# Patient Record
Sex: Female | Born: 1985 | Race: White | Hispanic: No | Marital: Married | Smoking: Former smoker
Health system: Southern US, Community
[De-identification: ages and names within clinical notes are randomized; demographics above are authoritative.]

## PROBLEM LIST (undated history)

## (undated) DIAGNOSIS — G8929 Other chronic pain: Secondary | ICD-10-CM

## (undated) DIAGNOSIS — M545 Low back pain, unspecified: Secondary | ICD-10-CM

## (undated) DIAGNOSIS — G43909 Migraine, unspecified, not intractable, without status migrainosus: Secondary | ICD-10-CM

## (undated) HISTORY — DX: Other chronic pain: G89.29

## (undated) HISTORY — DX: Low back pain, unspecified: M54.50

## (undated) HISTORY — DX: Migraine, unspecified, not intractable, without status migrainosus: G43.909

## (undated) HISTORY — PX: WISDOM TOOTH EXTRACTION: SHX21

---
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
## 2013-08-02 ENCOUNTER — Ambulatory Visit (INDEPENDENT_AMBULATORY_CARE_PROVIDER_SITE_OTHER): Payer: BC Managed Care – PPO | Admitting: Family Medicine

## 2013-08-02 VITALS — BP 130/70 | HR 88 | Temp 98.0°F | Resp 16 | Ht 66.5 in | Wt 184.0 lb

## 2013-08-02 DIAGNOSIS — Z0000 Encounter for general adult medical examination without abnormal findings: Secondary | ICD-10-CM

## 2013-08-02 LAB — COMPREHENSIVE METABOLIC PANEL
ALT: 18 U/L (ref 0–35)
AST: 18 U/L (ref 0–37)
Albumin: 4.5 g/dL (ref 3.5–5.2)
Alkaline Phosphatase: 68 U/L (ref 39–117)
BUN: 7 mg/dL (ref 6–23)
CO2: 29 mEq/L (ref 19–32)
Calcium: 9.9 mg/dL (ref 8.4–10.5)
Chloride: 102 mEq/L (ref 96–112)
Creat: 0.82 mg/dL (ref 0.50–1.10)
Glucose, Bld: 85 mg/dL (ref 70–99)
Potassium: 4.1 mEq/L (ref 3.5–5.3)
Sodium: 139 mEq/L (ref 135–145)
Total Bilirubin: 0.5 mg/dL (ref 0.3–1.2)
Total Protein: 7.3 g/dL (ref 6.0–8.3)

## 2013-08-02 LAB — CBC WITH DIFFERENTIAL/PLATELET
Basophils Absolute: 0 10*3/uL (ref 0.0–0.1)
Basophils Relative: 0 % (ref 0–1)
Eosinophils Absolute: 0.1 10*3/uL (ref 0.0–0.7)
Eosinophils Relative: 1 % (ref 0–5)
HCT: 36.8 % (ref 36.0–46.0)
Hemoglobin: 13.1 g/dL (ref 12.0–15.0)
Lymphocytes Relative: 21 % (ref 12–46)
Lymphs Abs: 1.7 10*3/uL (ref 0.7–4.0)
MCH: 29.9 pg (ref 26.0–34.0)
MCHC: 35.6 g/dL (ref 30.0–36.0)
MCV: 84 fL (ref 78.0–100.0)
Monocytes Absolute: 0.7 10*3/uL (ref 0.1–1.0)
Monocytes Relative: 9 % (ref 3–12)
Neutro Abs: 5.4 10*3/uL (ref 1.7–7.7)
Neutrophils Relative %: 69 % (ref 43–77)
Platelets: 330 10*3/uL (ref 150–400)
RBC: 4.38 MIL/uL (ref 3.87–5.11)
RDW: 12.9 % (ref 11.5–15.5)
WBC: 7.9 10*3/uL (ref 4.0–10.5)

## 2013-08-02 LAB — LIPID PANEL
Cholesterol: 169 mg/dL (ref 0–200)
HDL: 47 mg/dL (ref >39)
LDL Cholesterol: 100 mg/dL — ABNORMAL HIGH (ref 0–99)
Total CHOL/HDL Ratio: 3.6 Ratio
Triglycerides: 109 mg/dL (ref <150)
VLDL: 22 mg/dL (ref 0–40)

## 2013-08-02 LAB — TSH: TSH: 1.844 u[IU]/mL (ref 0.350–4.500)

## 2013-08-02 NOTE — Progress Notes (Signed)
Subjective:    Patient ID: Alice Mcbride, female    DOB: 20-Nov-1985, 28 y.o.   MRN: [id number] Chief Complaint  Patient presents with  . Annual Exam    HPI This chart was scribed for Norberto SorensonEva Shaw, MD by Andrew Auaven Small, ED Scribe. This patient was seen in room 14 and the patient's care was started at 9:15 AM.  HPI Comments: Alice Mcbride is a 28 y.o. female who presents to [facility] requesting a physical. 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 Pt reports she has never had a pap smear but states that her menstrual cycle is regular.  She is unsure if she received the Gardasil vaccine but did receive a series of 3 injections that were required when she started w/ [facility]. All immunizations are UTD per EMS requirements. She reports taking a daily vitamin and that she drinks plenty of milk for her calcium.  She is involved in a stable monogamous relationship, sexually active w/ 1 woman only, they have children in the home.  History reviewed. No pertinent past medical history. History reviewed. No pertinent past surgical history. No current outpatient prescriptions on file prior to visit.   No current facility-administered medications on file prior to visit.   No Known Allergies History reviewed. No pertinent family history. History   Social History  . Marital Status: Significant Other    Spouse Name: N/A    Number of Children: N/A  . Years of Education: N/A   Social History Main Topics  . Smoking status: Never Smoker   . Smokeless tobacco: None  . Alcohol Use: No  . Drug Use: No  . Sexual Activity: Yes   Other Topics Concern  . None   Social History Narrative  . None    Review of Systems  All other systems reviewed and are negative.      BP 130/70  Pulse 88   Temp(Src) 98 F (36.7 C) (Oral)  Resp 16  Ht 5' 6.5" (1.689 m)  Wt 184 lb (83.462 kg)  BMI 29.26 kg/m2  SpO2 98%  LMP 07/12/2013  Objective:   Physical Exam  Nursing note and vitals reviewed. Constitutional: She is oriented to person, place, and time. She appears well-developed and well-nourished. No distress.  HENT:  Head: Normocephalic and atraumatic.  Right Ear: Hearing, tympanic membrane, external ear and ear canal normal.  Left Ear: Hearing, tympanic membrane, external ear and ear canal normal.  Nose: Nose normal. No mucosal edema or rhinorrhea.  Mouth/Throat: Uvula is midline, oropharynx is clear and moist and mucous membranes are normal. No posterior oropharyngeal erythema.  Eyes: Conjunctivae and EOM are normal. Pupils are equal, round, and reactive to light. Right eye exhibits no discharge. Left eye exhibits no discharge. No scleral icterus.  Neck: Normal range of motion. Neck supple. No tracheal deviation present. No thyromegaly present.  Cardiovascular: Normal rate, regular rhythm, normal heart sounds and intact distal pulses.   No murmur heard. Pulmonary/Chest: Effort normal and breath sounds normal. No respiratory distress. She has no wheezes. She has no rales.  Abdominal: Soft. Bowel sounds are normal. She exhibits no distension and no mass. There is no tenderness. There is no rebound and no guarding.  Musculoskeletal: Normal range of motion. She exhibits no edema.  Lymphadenopathy:  She has no cervical adenopathy.  Neurological: She is alert and oriented to person, place, and time. She has normal reflexes.  Reflex Scores:      Patellar reflexes are 2+ on the right side and 2+ on the left side. Skin: Skin is warm and dry. She is not diaphoretic. No erythema.  Psychiatric: She has a normal mood and affect. Her behavior is normal.      Assessment & Plan:  Pt denies pap smear. She reports that she will receive a pap smear next year. Routine general medical examination  at a health care facility - Plan: CBC with Differential, Comprehensive metabolic panel, Lipid panel, TSH No h/o prior pap smear so pt is definitely due despite being very low risk.  She declines this yr but will plan to have done next yr.  Not sure if pt has had guardasil vaccine - doubtful.  Pt likely has had Hep B vaccine. TdaP and flu UTD.  I personally performed the services described in this documentation, which was scribed in my presence. The recorded information has been reviewed and considered, and addended by me as needed.  Norberto Sorenson, MD MPH

## 2013-08-02 NOTE — Patient Instructions (Signed)

## 2013-08-05 ENCOUNTER — Encounter: Payer: Self-pay | Admitting: Family Medicine

## 2015-08-09 DIAGNOSIS — Z79899 Other long term (current) drug therapy: Secondary | ICD-10-CM | POA: Diagnosis not present

## 2015-08-09 DIAGNOSIS — L700 Acne vulgaris: Secondary | ICD-10-CM | POA: Diagnosis not present

## 2015-08-17 DIAGNOSIS — Z79899 Other long term (current) drug therapy: Secondary | ICD-10-CM | POA: Diagnosis not present

## 2015-08-17 DIAGNOSIS — L700 Acne vulgaris: Secondary | ICD-10-CM | POA: Diagnosis not present

## 2015-08-20 MED FILL — MYORISAN 40 MG CAPSULE: 40 | 30 days supply | Qty: 60 | Fill #0

## 2015-09-14 DIAGNOSIS — Z79899 Other long term (current) drug therapy: Secondary | ICD-10-CM | POA: Diagnosis not present

## 2015-09-14 DIAGNOSIS — L308 Other specified dermatitis: Secondary | ICD-10-CM | POA: Diagnosis not present

## 2015-09-14 DIAGNOSIS — L700 Acne vulgaris: Secondary | ICD-10-CM | POA: Diagnosis not present

## 2015-09-14 MED FILL — TRIAMCINOLONE 0.1% OINTMENT: 0.1 | 14 days supply | Qty: 80 | Fill #0

## 2015-10-04 DIAGNOSIS — L700 Acne vulgaris: Secondary | ICD-10-CM | POA: Diagnosis not present

## 2015-10-04 DIAGNOSIS — Z79899 Other long term (current) drug therapy: Secondary | ICD-10-CM | POA: Diagnosis not present

## 2015-10-08 MED FILL — MYORISAN 40 MG CAPSULE: 40 | 30 days supply | Qty: 60 | Fill #0

## 2015-11-09 DIAGNOSIS — M542 Cervicalgia: Secondary | ICD-10-CM | POA: Diagnosis not present

## 2016-10-13 ENCOUNTER — Ambulatory Visit (INDEPENDENT_AMBULATORY_CARE_PROVIDER_SITE_OTHER): Payer: BLUE CROSS/BLUE SHIELD | Admitting: Physician Assistant

## 2016-10-13 VITALS — BP 135/83 | HR 80 | Temp 98.9°F | Resp 18 | Ht 69.0 in | Wt 197.0 lb

## 2016-10-13 DIAGNOSIS — R51 Headache: Secondary | ICD-10-CM | POA: Diagnosis not present

## 2016-10-13 DIAGNOSIS — R519 Headache, unspecified: Secondary | ICD-10-CM | POA: Insufficient documentation

## 2016-10-13 DIAGNOSIS — G4489 Other headache syndrome: Secondary | ICD-10-CM | POA: Diagnosis not present

## 2016-10-13 MED ORDER — BUTALBITAL-APAP-CAFFEINE 50-325-40 MG PO TABS: 1.0000 | ORAL_TABLET | Freq: Four times a day (QID) | ORAL | 0 refills | Status: DC | PRN

## 2016-10-13 MED ORDER — CYCLOBENZAPRINE HCL 10 MG PO TABS: 10.0000 mg | ORAL_TABLET | Freq: Three times a day (TID) | ORAL | 0 refills | Status: DC | PRN

## 2016-10-13 NOTE — Progress Notes (Signed)
Patient ID: Alice Mcbride, female     DOB: 1986-05-30, 31 y.o.    MRN: [id number]  PCP: No PCP Per Patient  Chief Complaint  Patient presents with  . Migraine    x 3 weeks    Subjective:   This patient is new to me and presents for evaluation of headache. Previously seen in this office 08/02/2013 for routine exam.  3 weeks ago, after getting out of the shower, leaned over to dry her hair, as per her typical routine. Stood up and felt a sharp pain LEFT side of head behind the ear, that lasted about 30 minutes.  Recurred a few days later with a similar movement, the same pain and location. Has had pain constantly since then. Most of the pain is now at the back of her head, at the top of the neck. Rates the pain 5/10 presently, 8/10 at its worst. Ibuprofen helps a little, reducing the pain to 3/10. Quality is throbbing ache, occasional sharp pain.  Claritin D, ibuprofen, Excedrin without benefit.  Last Thursday (10/05/2016) presented to another facility, received Augmentin, steroid injection and Toradol injection for presumed sinusitis, even though she didn't feel like previous sinusitis episodes she's had. Has had no improvement.  Rapid position changes cause some dizziness. No visual changes. No CP, palpitation, SOB. A couple of episodes of nausea since starting Augmentin. No vomiting or diarrhea. Occasional ear pain, bilateral, lasting only a couple of seconds. No shoulder, arm/hand pain or tingling. Not dropping things. No loss of bowel or bladder control. No urinary frequency, urgency or burning. No diarrhea.  Unable to sleep due to the pain. Exhausted. Unable to attend family functions.  Always had frequent HAs. Never evaluated, but she calls them "migraines," and describes them as bad headaches resolving with rest in a dark, quiet room.   Review of Systems As above.  Prior to Admission medications   Medication Sig Start Date End Date Taking? Authorizing  Provider  amoxicillin-clavulanate (AUGMENTIN) 875-125 MG tablet Take 1 tablet by mouth 2 (two) times daily. 10/06/16 10/14/16 Yes Historical Provider, MD     No Known Allergies   There are no active problems to display for this patient.    No family history on file.   Social History   Social History  . Marital status: Significant Other    Spouse name: Deanna  . Number of children: 0  . Years of education: EMT certification   Occupational History  . EMT   .  [employer]   Social History Main Topics  . Smoking status: Never Smoker  . Smokeless tobacco: Never Used  . Alcohol use No  . Drug use: No  . Sexual activity: Yes    Partners: Female    Birth control/ protection: None   Other Topics Concern  . Not on file   Social History Narrative   Lives with her partner and her partner's two children.   Parents live in [place].         Objective:  Physical Exam  Constitutional: She is oriented to person, place, and time. She appears well-developed and well-nourished. She is active and cooperative. No distress.  BP 135/83   Pulse 80   Temp 98.9 F (37.2 C) (Oral)   Resp 18   Ht 5\' 9"  (1.753 m)   Wt 197 lb (89.4 kg)   LMP 09/03/2016   SpO2 99%   BMI 29.09 kg/m   HENT:  Head: Normocephalic and atraumatic.  Right Ear: Hearing, tympanic membrane, external ear and ear canal normal.  Left Ear: Hearing, tympanic membrane, external ear and ear canal normal.  Nose: Nose normal. Right sinus exhibits no maxillary sinus tenderness and no frontal sinus tenderness. Left sinus exhibits no maxillary sinus tenderness and no frontal sinus tenderness.  Mouth/Throat: Uvula is midline, oropharynx is clear and moist and mucous membranes are normal. No oral lesions. Normal dentition. No oropharyngeal exudate.  Eyes: Conjunctivae, EOM and lids are normal. Pupils are equal, round, and reactive to light. No scleral icterus.  Fundoscopic exam:      The right eye shows no  hemorrhage and no papilledema. The right eye shows red reflex.       The left eye shows no hemorrhage and no papilledema. The left eye shows red reflex.  Neck: Normal range of motion and full passive range of motion without pain. Neck supple. No spinous process tenderness and no muscular tenderness present. No neck rigidity. Normal range of motion present. No thyromegaly present.  Cardiovascular: Normal rate, regular rhythm and normal heart sounds.   Pulses:      Radial pulses are 2+ on the right side, and 2+ on the left side.  Pulmonary/Chest: Effort normal and breath sounds normal.  Lymphadenopathy:       Head (right side): No tonsillar, no preauricular, no posterior auricular and no occipital adenopathy present.       Head (left side): No tonsillar, no preauricular, no posterior auricular and no occipital adenopathy present.    She has no cervical adenopathy.       Right: No supraclavicular adenopathy present.       Left: No supraclavicular adenopathy present.  Neurological: She is alert and oriented to person, place, and time. She has normal strength. No cranial nerve deficit or sensory deficit.  Skin: Skin is warm, dry and intact. No rash noted. No cyanosis or erythema. Nails show no clubbing.  Psychiatric: She has a normal mood and affect. Her speech is normal and behavior is normal.      Assessment & Plan:  1. Other headache syndrome 2. Nonintractable episodic headache, unspecified headache type 2 different headache issues here. THe acute headache is most pressing. Location consistent with tension headache, but she has no tenderness. No neurologic findings. Treat with muscle relaxant and Fioricet (short term). Certainly possible this is migraine variant, though it is different from the headaches she typically has (which are likely migrainous), and the onset with raid position change seems unusual. Refer to neurology. - cyclobenzaprine (FLEXERIL) 10 MG tablet; Take 1 tablet (10 mg total)  by mouth 3 (three) times daily as needed for muscle spasms.  Dispense: 30 tablet; Refill: 0 - butalbital-acetaminophen-caffeine (FIORICET, ESGIC) 50-325-40 MG tablet; Take 1-2 tablets by mouth every 6 (six) hours as needed for headache.  Dispense: 20 tablet; Refill: 0 - Ambulatory referral to Neurology   Fernande Brashelle S. Kallen Delatorre, PA-C Physician Assistant-Certified Primary Care at [facility]

## 2016-10-13 NOTE — Patient Instructions (Addendum)
Complete the Augmentin. Stay well hydrated.    IF you received an x-ray today, you will receive an invoice from [facility]. Please contact [facility] at [phone] with questions or concerns regarding your invoice.   IF you received labwork today, you will receive an invoice from [facility]. Please contact [facility] at [phone] with questions or concerns regarding your invoice.   Our billing staff will not be able to assist you with questions regarding bills from these companies.  You will be contacted with the lab results as soon as they are available. The fastest way to get your results is to activate your My Chart account. Instructions are located on the last page of this paperwork. If you have not heard from us regarding the results in 2 weeks, please contact this office.

## 2016-10-16 ENCOUNTER — Encounter: Payer: Self-pay | Admitting: Physician Assistant

## 2016-10-16 DIAGNOSIS — G4489 Other headache syndrome: Secondary | ICD-10-CM

## 2016-10-19 ENCOUNTER — Telehealth: Payer: Self-pay | Admitting: Physician Assistant

## 2016-10-19 DIAGNOSIS — G4489 Other headache syndrome: Secondary | ICD-10-CM

## 2016-10-19 NOTE — Telephone Encounter (Signed)
Pt is following up on her e-mail sent through MyChart. She said she was able to get an appt scheduled with [facility] for 11/08/16. She said the Fioricet is helping her migraines but she only has 4 or 5 left. Pt was wondering if she could get a refill of this medication since her appt is a few weeks away. Best pt callback number is [phone].

## 2016-10-20 MED ORDER — BUTALBITAL-APAP-CAFFEINE 50-325-40 MG PO TABS: 1.0000 | ORAL_TABLET | Freq: Four times a day (QID) | ORAL | 0 refills | Status: AC | PRN

## 2016-10-20 NOTE — Telephone Encounter (Signed)
Meds ordered this encounter  Medications  . butalbital-acetaminophen-caffeine (FIORICET, ESGIC) 50-325-40 MG tablet    Sig: Take 1-2 tablets by mouth every 6 (six) hours as needed for headache.    Dispense:  20 tablet    Refill:  0    Order Specific Question:   Supervising Provider    Answer:   Sherren Mocha [phone]   Patient notified via My Chart.

## 2016-10-20 NOTE — Telephone Encounter (Signed)
Done earlier today. Patient notified via My Chart.

## 2016-11-01 ENCOUNTER — Encounter: Payer: Self-pay | Admitting: Physician Assistant

## 2016-11-02 ENCOUNTER — Other Ambulatory Visit: Payer: Self-pay | Admitting: Physician Assistant

## 2016-11-02 MED ORDER — BUTALBITAL-APAP-CAFFEINE 50-325-40 MG PO TABS: 1.0000 | ORAL_TABLET | Freq: Four times a day (QID) | ORAL | 0 refills | Status: DC | PRN

## 2016-11-02 NOTE — Telephone Encounter (Signed)
Meds ordered this encounter  Medications  . butalbital-acetaminophen-caffeine (FIORICET, ESGIC) 50-325-40 MG tablet    Sig: Take 1-2 tablets by mouth every 6 (six) hours as needed for headache.    Dispense:  20 tablet    Refill:  0    Order Specific Question:   Supervising Provider    Answer:   SHAW, EVA N [[id number]]   Patient notified via My Chart.  

## 2016-11-05 NOTE — Telephone Encounter (Signed)
4/12/18it was refilled

## 2016-11-07 ENCOUNTER — Encounter: Payer: Self-pay | Admitting: Physician Assistant

## 2016-11-07 ENCOUNTER — Ambulatory Visit: Payer: BLUE CROSS/BLUE SHIELD | Admitting: Neurology

## 2016-11-09 ENCOUNTER — Encounter: Payer: Self-pay | Admitting: Physician Assistant

## 2016-11-10 ENCOUNTER — Encounter: Payer: Self-pay | Admitting: Physician Assistant

## 2016-11-10 DIAGNOSIS — G4489 Other headache syndrome: Secondary | ICD-10-CM

## 2016-11-13 MED ORDER — BUTALBITAL-APAP-CAFFEINE 50-325-40 MG PO TABS: 1.0000 | ORAL_TABLET | Freq: Four times a day (QID) | ORAL | 0 refills | Status: DC | PRN

## 2016-11-13 MED ORDER — VALACYCLOVIR HCL 1 G PO TABS: ORAL_TABLET | ORAL | 1 refills | Status: DC

## 2016-11-13 MED ORDER — CYCLOBENZAPRINE HCL 10 MG PO TABS: 10.0000 mg | ORAL_TABLET | Freq: Three times a day (TID) | ORAL | 0 refills | Status: DC | PRN

## 2016-11-13 NOTE — Telephone Encounter (Signed)
Meds ordered this encounter  Medications  . butalbital-acetaminophen-caffeine (FIORICET, ESGIC) 50-325-40 MG tablet    Sig: Take 1-2 tablets by mouth every 6 (six) hours as needed for headache.    Dispense:  20 tablet    Refill:  0    Order Specific Question:   Supervising Provider    Answer:   SHAW, EVA N [[id number]]  . cyclobenzaprine (FLEXERIL) 10 MG tablet    Sig: Take 1 tablet (10 mg total) by mouth 3 (three) times daily as needed for muscle spasms.    Dispense:  30 tablet    Refill:  0    Order Specific Question:   Supervising Provider    Answer:   Clelia Croft, EVA N [[id number]]

## 2016-11-21 ENCOUNTER — Ambulatory Visit (INDEPENDENT_AMBULATORY_CARE_PROVIDER_SITE_OTHER): Payer: BLUE CROSS/BLUE SHIELD | Admitting: Neurology

## 2016-11-21 ENCOUNTER — Encounter: Payer: Self-pay | Admitting: Neurology

## 2016-11-21 VITALS — BP 129/89 | HR 74 | Ht 68.5 in | Wt 189.0 lb

## 2016-11-21 DIAGNOSIS — R51 Headache with orthostatic component, not elsewhere classified: Secondary | ICD-10-CM

## 2016-11-21 DIAGNOSIS — G43011 Migraine without aura, intractable, with status migrainosus: Secondary | ICD-10-CM | POA: Diagnosis not present

## 2016-11-21 DIAGNOSIS — H539 Unspecified visual disturbance: Secondary | ICD-10-CM | POA: Diagnosis not present

## 2016-11-21 DIAGNOSIS — R519 Headache, unspecified: Secondary | ICD-10-CM

## 2016-11-21 DIAGNOSIS — G8929 Other chronic pain: Secondary | ICD-10-CM

## 2016-11-21 MED ORDER — PROCHLORPERAZINE MALEATE 10 MG PO TABS: ORAL_TABLET | ORAL | 3 refills | Status: DC

## 2016-11-21 MED ORDER — METHYLPREDNISOLONE 4 MG PO TBPK: ORAL_TABLET | ORAL | 1 refills | Status: DC

## 2016-11-21 MED ORDER — SUMATRIPTAN SUCCINATE 100 MG PO TABS: 100.0000 mg | ORAL_TABLET | Freq: Once | ORAL | 12 refills | Status: DC | PRN

## 2016-11-21 MED ORDER — TOPIRAMATE ER 100 MG PO SPRINKLE CAP24: 100.0000 mg | EXTENDED_RELEASE_CAPSULE | Freq: Every day | ORAL | 12 refills | Status: DC

## 2016-11-21 NOTE — Patient Instructions (Addendum)
Remember to drink plenty of fluid, eat healthy meals and do not skip any meals. Try to eat protein with a every meal and eat a healthy snack such as fruit or nuts in between meals. Try to keep a regular sleep-wake schedule and try to exercise daily, particularly in the form of walking, 20-30 minutes a day, if you can.   As far as your medications are concerned, I would like to suggest:  Remember to drink plenty of fluid, eat healthy meals and do not skip any meals. Try to eat protein with a every meal and eat a healthy snack such as fruit or nuts in between meals. Try to keep a regular sleep-wake schedule and try to exercise daily, particularly in the form of walking, 20-30 minutes a day, if you can.   As far as your medications are concerned, I would like to suggest: - Stop daily over the counter med use (Tylenol, excedrin, ibuprofen, Fioricet, alleve) Do not tak emore than 2-3x in one week - For the next 6 days take steroids (medrol) and compazine. Watch for sedation, do not drive unless you know how the compazine affects you. With flexeril,  may increase sedation.  -At onset of Migraine Imitrex and may also take compazine. Please take one tablet at the onset of your headache. If it does not improve the symptoms please take one additional tablet. Do not take more then 2 tablets in 24hrs. Do not take use more then 2 to 3 days in a week. - Qudexy week one , week two  week three . Takes 4-6 weeks for medications to work.  As far as diagnostic testing: If headaches persist need MRI brain  Discussed; To prevent or relieve headaches, try the following:  Cool Compress. Lie down and place a cool compress on your head.   Avoid headache triggers. If certain foods or odors seem to have triggered your migraines in the past, avoid them. A headache diary might help you identify triggers.   Include physical activity in your daily routine. Try a daily walk or other moderate aerobic exercise.    Manage stress. Find healthy ways to cope with the stressors, such as delegating tasks on your to-do list.   Practice relaxation techniques. Try deep breathing, yoga, massage and visualization.   Eat regularly. Eating regularly scheduled meals and maintaining a healthy diet might help prevent headaches. Also, drink plenty of fluids.   Follow a regular sleep schedule. Sleep deprivation might contribute to headaches  Consider biofeedback. With this mind-body technique, you learn to control certain bodily functions - such as muscle tension, heart rate and blood pressure - to prevent headaches or reduce headache pain.    Proceed to emergency room if you experience new or worsening symptoms or symptoms do not resolve, if you have new neurologic symptoms or if headache is severe, or for any concerning symptom.    As far as diagnostic testing: MRI brain  I would like to see you back in 6-8 weeks, sooner if we need to. Please call us with any interim questions, concerns, problems, updates or refill requests.   Our phone number is [phone]. We also have an after hours call service for urgent matters and there is a physician on-call for urgent questions. For any emergencies you know to    Methylprednisolone tablets What is this medicine? METHYLPREDNISOLONE (meth ill pred NISS oh lone) is a corticosteroid. It is commonly used to treat inflammation of the skin, joints, lungs, and other organs.  Common conditions treated include asthma, allergies, and arthritis. It is also used for other conditions, such as blood disorders and diseases of the adrenal glands. This medicine may be used for other purposes; ask your health care provider or pharmacist if you have questions. COMMON BRAND NAME(S): Medrol, Medrol Dosepak What should I tell my health care provider before I take this medicine? They need to know if you have any of these conditions: -Cushing's syndrome -eye disease, vision  problems -diabetes -glaucoma -heart disease -high blood pressure -infection (especially a virus infection such as chickenpox, cold sores, or herpes) -liver disease -mental illness -myasthenia gravis -osteoporosis -recently received or scheduled to receive a vaccine -seizures -stomach or intestine problems -thyroid disease -an unusual or allergic reaction to lactose, methylprednisolone, other medicines, foods, dyes, or preservatives -pregnant or trying to get pregnant -breast-feeding How should I use this medicine? Take this medicine by mouth with a glass of water. Follow the directions on the prescription label. Take this medicine with food. If you are taking this medicine once a day, take it in the morning. Do not take it more often than directed. Do not suddenly stop taking your medicine because you may develop a severe reaction. Your doctor will tell you how much medicine to take. If your doctor wants you to stop the medicine, the dose may be slowly lowered over time to avoid any side effects. Talk to your pediatrician regarding the use of this medicine in children. Special care may be needed. Overdosage: If you think you have taken too much of this medicine contact a poison control center or emergency room at once. NOTE: This medicine is only for you. Do not share this medicine with others. What if I miss a dose? If you miss a dose, take it as soon as you can. If it is almost time for your next dose, talk to your doctor or health care professional. You may need to miss a dose or take an extra dose. Do not take double or extra doses without advice. What may interact with this medicine? Do not take this medicine with any of the following medications: -alefacept -echinacea -live virus vaccines -metyrapone -mifepristone This medicine may also interact with the following medications: -amphotericin B -aspirin and aspirin-like medicines -certain antibiotics like erythromycin,  clarithromycin, troleandomycin -certain medicines for diabetes -certain medicines for fungal infections like ketoconazole -certain medicines for seizures like carbamazepine, phenobarbital, phenytoin -certain medicines that treat or prevent blood clots like warfarin -cholestyramine -cyclosporine -digoxin -diuretics -female hormones, like estrogens and birth control pills -isoniazid -NSAIDs, medicines for pain inflammation, like ibuprofen or naproxen -other medicines for myasthenia gravis -rifampin -vaccines This list may not describe all possible interactions. Give your health care provider a list of all the medicines, herbs, non-prescription drugs, or dietary supplements you use. Also tell them if you smoke, drink alcohol, or use illegal drugs. Some items may interact with your medicine. What should I watch for while using this medicine? Tell your doctor or healthcare professional if your symptoms do not start to get better or if they get worse. Do not stop taking except on your doctor's advice. You may develop a severe reaction. Your doctor will tell you how much medicine to take. This medicine may increase your risk of getting an infection. Tell your doctor or health care professional if you are around anyone with measles or chickenpox, or if you develop sores or blisters that do not heal properly. This medicine may affect blood sugar levels. If you have  diabetes, check with your doctor or health care professional before you change your diet or the dose of your diabetic medicine. Tell your doctor or health care professional right away if you have any change in your eyesight. Using this medicine for a long time may increase your risk of low bone mass. Talk to your doctor about bone health. What side effects may I notice from receiving this medicine? Side effects that you should report to your doctor or health care professional as soon as possible: -allergic reactions like skin rash, itching  or hives, swelling of the face, lips, or tongue -bloody or tarry stools -changes in vision -hallucination, loss of contact with reality -muscle cramps -muscle pain -palpitations -signs and symptoms of high blood sugar such as dizziness; dry mouth; dry skin; fruity breath; nausea; stomach pain; increased hunger or thirst; increased urination -signs and symptoms of infection like fever or chills; cough; sore throat; pain or trouble passing urine -trouble passing urine or change in the amount of urine Side effects that usually do not require medical attention (report to your doctor or health care professional if they continue or are bothersome): -changes in emotions or mood -constipation -diarrhea -excessive hair growth on the face or body -headache -nausea, vomiting -trouble sleeping -weight gain This list may not describe all possible side effects. Call your doctor for medical advice about side effects. You may report side effects to FDA at 1-800-FDA-1088. Where should I keep my medicine? Keep out of the reach of children. Store at room temperature between 20 and 25 degrees C (68 and 77 degrees F). Throw away any unused medicine after the expiration date. NOTE: This sheet is a summary. It may not cover all possible information. If you have questions about this medicine, talk to your doctor, pharmacist, or health care provider.  2018 [employer] Standard (2015-09-16 15:53:30) call 911 or go to the nearest emergency room Prochlorperazine tablets What is this medicine? PROCHLORPERAZINE (proe klor PER a zeen) helps to control severe nausea and vomiting. This medicine is also used to treat schizophrenia. It can also help patients who experience anxiety that is not due to psychological illness. This medicine may be used for other purposes; ask your health care provider or pharmacist if you have questions. COMMON BRAND NAME(S): Compazine What should I tell my health care provider before I  take this medicine? They need to know if you have any of these conditions: -blood disorders or disease -dementia -liver disease or jaundice -Parkinson's disease -uncontrollable movement disorder -an unusual or allergic reaction to prochlorperazine, other medicines, foods, dyes, or preservatives -pregnant or trying to get pregnant -breast-feeding How should I use this medicine? Take this medicine by mouth with a glass of water. Follow the directions on the prescription label. Take your doses at regular intervals. Do not take your medicine more often than directed. Do not stop taking this medicine suddenly. This can cause nausea, vomiting, and dizziness. Ask your doctor or health care professional for advice. Talk to your pediatrician regarding the use of this medicine in children. Special care may be needed. While this drug may be prescribed for children as young as 2 years for selected conditions, precautions do apply. Overdosage: If you think you have taken too much of this medicine contact a poison control center or emergency room at once. NOTE: This medicine is only for you. Do not share this medicine with others. What if I miss a dose? If you miss a dose, take it as soon as you  can. If it is almost time for your next dose, take only that dose. Do not take double or extra doses. What may interact with this medicine? Do not take this medicine with any of the following medications: -amoxapine -antidepressants like citalopram, escitalopram, fluoxetine, paroxetine, and sertraline -deferoxamine -dofetilide -maprotiline -tricyclic antidepressants like amitriptyline, clomipramine, imipramine, nortiptyline and others This medicine may also interact with the following medications: -lithium -medicines for pain -phenytoin -propranolol -warfarin This list may not describe all possible interactions. Give your health care provider a list of all the medicines, herbs, non-prescription drugs, or  dietary supplements you use. Also tell them if you smoke, drink alcohol, or use illegal drugs. Some items may interact with your medicine. What should I watch for while using this medicine? Visit your doctor or health care professional for regular checks on your progress. You may get drowsy or dizzy. Do not drive, use machinery, or do anything that needs mental alertness until you know how this medicine affects you. Do not stand or sit up quickly, especially if you are an older patient. This reduces the risk of dizzy or fainting spells. Alcohol may interfere with the effect of this medicine. Avoid alcoholic drinks. This medicine can reduce the response of your body to heat or cold. Dress warm in cold weather and stay hydrated in hot weather. If possible, avoid extreme temperatures like saunas, hot tubs, very hot or cold showers, or activities that can cause dehydration such as vigorous exercise. This medicine can make you more sensitive to the sun. Keep out of the sun. If you cannot avoid being in the sun, wear protective clothing and use sunscreen. Do not use sun lamps or tanning beds/booths. Your mouth may get dry. Chewing sugarless gum or sucking hard candy, and drinking plenty of water may help. Contact your doctor if the problem does not go away or is severe. What side effects may I notice from receiving this medicine? Side effects that you should report to your doctor or health care professional as soon as possible: -blurred vision -breast enlargement in men or women -breast milk in women who are not breast-feeding -chest pain, fast or irregular heartbeat -confusion, restlessness -dark yellow or brown urine -difficulty breathing or swallowing -dizziness or fainting spells -drooling, shaking, movement difficulty (shuffling walk) or rigidity -fever, chills, sore throat -involuntary or uncontrollable movements of the eyes, mouth, head, arms, and legs -seizures -stomach area pain -unusually  weak or tired -unusual bleeding or bruising -yellowing of skin or eyes Side effects that usually do not require medical attention (report to your doctor or health care professional if they continue or are bothersome): -difficulty passing urine -difficulty sleeping -headache -sexual dysfunction -skin rash, or itching This list may not describe all possible side effects. Call your doctor for medical advice about side effects. You may report side effects to FDA at 1-800-FDA-1088. Where should I keep my medicine? Keep out of the reach of children. Store at room temperature between 15 and 30 degrees C (59 and 86 degrees F). Protect from light. Throw away any unused medicine after the expiration date. NOTE: This sheet is a summary. It may not cover all possible information. If you have questions about this medicine, talk to your doctor, pharmacist, or health care provider.  2018 [employer] Standard (2011-11-28 16:59:39)  Sumatriptan tablets What is this medicine? SUMATRIPTAN (soo ma TRIP tan) is used to treat migraines with or without aura. An aura is a strange feeling or visual disturbance that warns you of an  attack. It is not used to prevent migraines. This medicine may be used for other purposes; ask your health care provider or pharmacist if you have questions. COMMON BRAND NAME(S): Imitrex, Migraine Pack What should I tell my health care provider before I take this medicine? They need to know if you have any of these conditions: -circulation problems in fingers and toes -diabetes -heart disease -high blood pressure -high cholesterol -history of irregular heartbeat -history of stroke -kidney disease -liver disease -postmenopausal or surgical removal of uterus and ovaries -seizures -smoke tobacco -stomach or intestine problems -an unusual or allergic reaction to sumatriptan, other medicines, foods, dyes, or preservatives -pregnant or trying to get pregnant -breast-feeding How  should I use this medicine? Take this medicine by mouth with a glass of water. Follow the directions on the prescription label. This medicine is taken at the first symptoms of a migraine. It is not for everyday use. If your migraine headache returns after one dose, you can take another dose as directed. You must leave at least 2 hours between doses, and do not take more than 100 mg as a single dose. Do not take more than 200 mg total in any 24 hour period. If there is no improvement at all after the first dose, do not take a second dose without talking to your doctor or health care professional. Do not take your medicine more often than directed. Talk to your pediatrician regarding the use of this medicine in children. Special care may be needed. Overdosage: If you think you have taken too much of this medicine contact a poison control center or emergency room at once. NOTE: This medicine is only for you. Do not share this medicine with others. What if I miss a dose? This does not apply; this medicine is not for regular use. What may interact with this medicine? Do not take this medicine with any of the following medicines: -cocaine -ergot alkaloids like dihydroergotamine, ergonovine, ergotamine, methylergonovine -feverfew -MAOIs like Carbex, Eldepryl, Marplan, Nardil, and Parnate -other medicines for migraine headache like almotriptan, eletriptan, frovatriptan, naratriptan, rizatriptan, zolmitriptan -tryptophan This medicine may also interact with the following medications: -certain medicines for depression, anxiety, or psychotic disturbances This list may not describe all possible interactions. Give your health care provider a list of all the medicines, herbs, non-prescription drugs, or dietary supplements you use. Also tell them if you smoke, drink alcohol, or use illegal drugs. Some items may interact with your medicine. What should I watch for while using this medicine? Only take this  medicine for a migraine headache. Take it if you get warning symptoms or at the start of a migraine attack. It is not for regular use to prevent migraine attacks. You may get drowsy or dizzy. Do not drive, use machinery, or do anything that needs mental alertness until you know how this medicine affects you. To reduce dizzy or fainting spells, do not sit or stand up quickly, especially if you are an older patient. Alcohol can increase drowsiness, dizziness and flushing. Avoid alcoholic drinks. Smoking cigarettes may increase the risk of heart-related side effects from using this medicine. If you take migraine medicines for 10 or more days a month, your migraines may get worse. Keep a diary of headache days and medicine use. Contact your healthcare professional if your migraine attacks occur more frequently. What side effects may I notice from receiving this medicine? Side effects that you should report to your doctor or health care professional as soon as possible: -allergic  reactions like skin rash, itching or hives, swelling of the face, lips, or tongue -bloody or watery diarrhea -hallucination, loss of contact with reality -pain, tingling, numbness in the face, hands, or feet -seizures -signs and symptoms of a blood clot such as breathing problems; changes in vision; chest pain; severe, sudden headache; pain, swelling, warmth in the leg; trouble speaking; sudden numbness or weakness of the face, arm, or leg -signs and symptoms of a dangerous change in heartbeat or heart rhythm like chest pain; dizziness; fast or irregular heartbeat; palpitations, feeling faint or lightheaded; falls; breathing problems -signs and symptoms of a stroke like changes in vision; confusion; trouble speaking or understanding; severe headaches; sudden numbness or weakness of the face, arm, or leg; trouble walking; dizziness; loss of balance or coordination -stomach pain Side effects that usually do not require medical  attention (report to your doctor or health care professional if they continue or are bothersome): -changes in taste -facial flushing -headache -muscle cramps -muscle pain -nausea, vomiting -weak or tired This list may not describe all possible side effects. Call your doctor for medical advice about side effects. You may report side effects to FDA at 1-800-FDA-1088. Where should I keep my medicine? Keep out of the reach of children. Store at room temperature between 2 and 30 degrees C (36 and 86 degrees F). Throw away any unused medicine after the expiration date. NOTE: This sheet is a summary. It may not cover all possible information. If you have questions about this medicine, talk to your doctor, pharmacist, or health care provider.  2018 [employer] Standard (2015-08-12 12:38:23)  Topiramate extended-release capsules What is this medicine? TOPIRAMATE (toe PYRE a mate) is used to treat seizures in adults or children with epilepsy. It is also used for the prevention of migraine headaches. This medicine may be used for other purposes; ask your health care provider or pharmacist if you have questions. COMMON BRAND NAME(S): Trokendi XR What should I tell my health care provider before I take this medicine? They need to know if you have any of these conditions: -cirrhosis of the liver or liver disease -diarrhea -glaucoma -kidney stones or kidney disease -lung disease like asthma, obstructive pulmonary disease, emphysema -metabolic acidosis -on a ketogenic diet -scheduled for surgery or a procedure -suicidal thoughts, plans, or attempt; a previous suicide attempt by you or a family member -an unusual or allergic reaction to topiramate, other medicines, foods, dyes, or preservatives -pregnant or trying to get pregnant -breast-feeding How should I use this medicine? Take this medicine by mouth with a glass of water. Follow the directions on the prescription label. Trokendi XR capsules  must be swallowed whole. Do not sprinkle on food, break, crush, dissolve, or chew. Qudexy XR capsules may be swallowed whole or opened and sprinkled on a small amount of soft food. This mixture must be swallowed immediately. Do not chew or store mixture for later use. You may take this medicine with meals. Take your medicine at regular intervals. Do not take it more often than directed. Talk to your pediatrician regarding the use of this medicine in children. Special care may be needed. While Trokendi XR may be prescribed for children as young as 6 years and Qudexy XR may be prescribed for children as young as 2 years for selected conditions, precautions do apply. Overdosage: If you think you have taken too much of this medicine contact a poison control center or emergency room at once. NOTE: This medicine is only for you. Do not  share this medicine with others. What if I miss a dose? If you miss a dose, take it as soon as you can. If it is almost time for your next dose, take only that dose. Do not take double or extra doses. What may interact with this medicine? Do not take this medicine with any of the following medications: -probenecid This medicine may also interact with the following medications: -acetazolamide -alcohol -amitriptyline -birth control pills -digoxin -hydrochlorothiazide -lithium -medicines for pain, sleep, or muscle relaxation -metformin -methazolamide -other seizure or epilepsy medicines -pioglitazone -risperidone This list may not describe all possible interactions. Give your health care provider a list of all the medicines, herbs, non-prescription drugs, or dietary supplements you use. Also tell them if you smoke, drink alcohol, or use illegal drugs. Some items may interact with your medicine. What should I watch for while using this medicine? Visit your doctor or health care professional for regular checks on your progress. Do not stop taking this medicine suddenly.  This increases the risk of seizures if you are using this medicine to control epilepsy. Wear a medical identification bracelet or chain to say you have epilepsy or seizures, and carry a card that lists all your medicines. This medicine can decrease sweating and increase your body temperature. Watch for signs of deceased sweating or fever, especially in children. Avoid extreme heat, hot baths, and saunas. Be careful about exercising, especially in hot weather. Contact your health care provider right away if you notice a fever or decrease in sweating. You should drink plenty of fluids while taking this medicine. If you have had kidney stones in the past, this will help to reduce your chances of forming kidney stones. If you have stomach pain, with nausea or vomiting and yellowing of your eyes or skin, call your doctor immediately. You may get drowsy, dizzy, or have blurred vision. Do not drive, use machinery, or do anything that needs mental alertness until you know how this medicine affects you. To reduce dizziness, do not sit or stand up quickly, especially if you are an older patient. Alcohol can increase drowsiness and dizziness. Avoid alcoholic drinks. Do not drink alcohol for 6 hours before or 6 hours after taking Trokendi XR. If you notice blurred vision, eye pain, or other eye problems, seek medical attention at once for an eye exam. The use of this medicine may increase the chance of suicidal thoughts or actions. Pay special attention to how you are responding while on this medicine. Any worsening of mood, or thoughts of suicide or dying should be reported to your health care professional right away. This medicine may increase the chance of developing metabolic acidosis. If left untreated, this can cause kidney stones, bone disease, or slowed growth in children. Symptoms include breathing fast, fatigue, loss of appetite, irregular heartbeat, or loss of consciousness. Call your doctor immediately if you  experience any of these side effects. Also, tell your doctor about any surgery you plan on having while taking this medicine since this may increase your risk for metabolic acidosis. Birth control pills may not work properly while you are taking this medicine. Talk to your doctor about using an extra method of birth control. Women who become pregnant while using this medicine may enroll in the [place] American Antiepileptic Drug Pregnancy Registry by calling [phone]. This registry collects information about the safety of antiepileptic drug use during pregnancy. What side effects may I notice from receiving this medicine? Side effects that you should report to your  doctor or health care professional as soon as possible: -allergic reactions like skin rash, itching or hives, swelling of the face, lips, or tongue -decreased sweating and/or rise in body temperature -depression -difficulty breathing, fast or irregular breathing patterns -difficulty speaking -difficulty walking or controlling muscle movements -hearing impairment -redness, blistering, peeling or loosening of the skin, including inside the mouth -tingling, pain or numbness in the hands or feet -unusually weak or tired -worsening of mood, thoughts or actions of suicide or dying Side effects that usually do not require medical attention (report to your doctor or health care professional if they continue or are bothersome): -altered taste -back pain, joint or muscle aches and pains -diarrhea, or constipation -headache -loss of appetite -nausea -stomach upset, indigestion -tremors This list may not describe all possible side effects. Call your doctor for medical advice about side effects. You may report side effects to FDA at 1-800-FDA-1088. Where should I keep my medicine? Keep out of the reach of children. Store at room temperature between 15 and 30 degrees C (59 and 86 degrees F) in a tightly closed container. Protect from  moisture. Throw away any unused medicine after the expiration date. NOTE: This sheet is a summary. It may not cover all possible information. If you have questions about this medicine, talk to your doctor, pharmacist, or health care provider.  2018 Elsevier/Gold Standard (2015-10-29 12:33:11)

## 2016-11-21 NOTE — Progress Notes (Signed)
[facility]    Provider:  Dr Lucia Gaskins Referring Provider: Porfirio Oar, PA-C Primary Care Physician:  Porfirio Oar, PA-C  CC:  Migraines  HPI:  Alice Mcbride is a 31 y.o. female here as a referral from Dr. Leotis Shames for migraines worsening over the last 6 months in frequency and severity. 2 months ago she bent over and felt a sharp pain on the left side of her head and it happened again and it stayed. On the left side of the head. Turned into a constant migraine She had a tramadol shot and steroid shot at the end which helped for 6 hours and then came back. She went to [facility] on 3/23. [name] started flexeril at night to try and help her sleep, she was worn out due to the pain.The Fioricet helps sometimes She is taking Fioricet and has had the bottle refilled.   Her migraines include light sensitivity, nausea and vomiting, unilateral on either side or behind the eyes is very common, throbbing and pounding, smells don;t bother her but she is sensitive to light and sound. Unknown triggers. No food triggers. Most of the time she sleep well and get a goods night sleep. The flexeril helping her sleep. The headaches are waking her up for the last 2 months. She is having blurry vision. No snoring. She has daily headaches. Continuous headaches. At least 1/2 are migrainous. Headaches are worse laying down. Very tired. For the last 6 months its worsening and she is having headaches at least 12-15 a month.   Reviewed notes, labs and imaging from outside physicians, which showed:   Reviewed labs CMP and CBC normal.  Reviewed primary care notes. 3 weeks ago after getting out of the shower she leaned over to dry her hair and when she stood up she felt a sharp pain in the left side of the head behind the ear that lasted about 30 minutes. Recurred a few days later with similar movement same pain and location. Has had pain constantly since then. Most of the pain is not the back of  her head and at the top of the neck. Rates the pain as 5 out of 10 but can be 8 out of 10 at its worst. Ibuprofen helps a little, reducing the pain to 3 out of 10. Quality is throbbing ache that occasional sharp pain. Several days previous to last primary care appointment she presented to another facility, received Augmentin, steroid injection and Toradol injection for presumed sinusitis even though she didn't feel like she had any symptoms. She had minimal improvement. Rapid position changes cause some dizziness. She was treated with a muscle relaxant and Fioricet.  Review of Systems: Patient complains of symptoms per HPI as well as the following symptoms: No CP, no SOB. Pertinent negatives per HPI. All others negative.   Social History   Social History  . Marital status: Significant Other    Spouse name: Deanna  . Number of children: 0  . Years of education: EMT certification   Occupational History  . EMT     [employer]   Social History Main Topics  . Smoking status: Former Smoker    Quit date: 2011  . Smokeless tobacco: Never Used  . Alcohol use No  . Drug use: No  . Sexual activity: Yes    Partners: Female    Birth control/ protection: None   Other Topics Concern  . Not on file   Social History Narrative   Lives  with her partner and her partner's two children.   Parents live in [place].   Right-handed   Caffeine: 1-2 sodas per day    Family History  Problem Relation Age of Onset  . Migraines Neg Hx     Past Medical History:  Diagnosis Date  . Migraine     Past Surgical History:  Procedure Laterality Date  . WISDOM TOOTH EXTRACTION      Current Outpatient Prescriptions  Medication Sig Dispense Refill  . butalbital-acetaminophen-caffeine (FIORICET, ESGIC) 50-325-40 MG tablet Take 1-2 tablets by mouth every 6 (six) hours as needed for headache. 20 tablet 0  . cyclobenzaprine (FLEXERIL) 10 MG tablet Take 1 tablet (10 mg total) by mouth 3  (three) times daily as needed for muscle spasms. 30 tablet 0  . valACYclovir (VALTREX) 1000 MG tablet Take 2000 mg with onset of fever blister. Repeat in 12 hours. 30 tablet 1  . methylPREDNISolone (MEDROL DOSEPAK) 4 MG TBPK tablet Take pills once daily in the morning with food 21 tablet 1  . prochlorperazine (COMPAZINE) 10 MG tablet Take 1 tablet (10 mg total) by mouth every 6-8  hours as needed for nausea or vomiting or migraine/headache 30 tablet 3  . SUMAtriptan (IMITREX) 100 MG tablet Take 1 tablet (100 mg total) by mouth once as needed. May repeat in 2 hours if headache persists or recurs. 10 tablet 12  . Topiramate ER (QUDEXY XR) 100 MG CS24 Take 100 mg by mouth at bedtime. 30 each 12   No current facility-administered medications for this visit.     Allergies as of 11/21/2016 - Review Complete 11/21/2016  Allergen Reaction Noted  . Bee venom  11/21/2016    Vitals: BP 129/89   Pulse 74   Ht 5' 8.5" (1.74 m)   Wt 189 lb (85.7 kg)   BMI 28.32 kg/m  Last Weight:  Wt Readings from Last 1 Encounters:  11/21/16 189 lb (85.7 kg)   Last Height:   Ht Readings from Last 1 Encounters:  11/21/16 5' 8.5" (1.74 m)    Physical exam: Exam: Gen: NAD, conversant, well nourised, obese, well groomed                     CV: RRR, no MRG. No Carotid Bruits. No peripheral edema, warm, nontender Eyes: Conjunctivae clear without exudates or hemorrhage  Neuro: Detailed Neurologic Exam  Speech:    Speech is normal; fluent and spontaneous with normal comprehension.  Cognition:    The patient is oriented to person, place, and time;     recent and remote memory intact;     language fluent;     normal attention, concentration,     fund of knowledge Cranial Nerves:    The pupils are equal, round, and reactive to light. The fundi are normal and spontaneous venous pulsations are present. Visual fields are full to finger confrontation. Extraocular movements are intact. Trigeminal sensation is  intact and the muscles of mastication are normal. The face is symmetric. The palate elevates in the midline. Hearing intact. Voice is normal. Shoulder shrug is normal. The tongue has normal motion without fasciculations.   Coordination:    Normal finger to nose and heel to shin. Normal rapid alternating movements.   Gait:    Heel-toe and tandem gait are normal.   Motor Observation:    No asymmetry, no atrophy, and no involuntary movements noted. Tone:    Normal muscle tone.    Posture:    Posture  is normal. normal erect    Strength:    Strength is V/V in the upper and lower limbs.      Sensation: intact to LT     Reflex Exam:  DTR's:    Deep tendon reflexes in the upper and lower extremities are normal bilaterally.   Toes:    The toes are downgoing bilaterally.   Clonus:    Clonus is absent.    Assessment/Plan:  This is a 31 year old female with a past medical history of migraines, medication overuse including analgesics and recent Fioricet, with recent headache exacerbation and persistent continuous head pain. She has concerning symptoms including acute onset, vision changes, positional headaches, nocturnal headaches and feel patient would benefit from an MRI of the brain with and without contrast to evaluate for lesions, space-occupying mass or even possibly intracranial hypertension.  Chronic daily headaches due to medication overuse  I had a long discussion with patient that her daily use of Fioricet, ibuprofen or Tylenol can cause medication overuse/rebound headache which is likely highly contributing to her chronic daily headaches (overusing Fioricet now, previous was overusing ibuprofen and over-the-counter analgesics). They only thing to do is to stop the medication unfortunately. In the timeframe after stopping at her headaches may get much worse. I will give her some medication to try to bridge that. She should significantly  improve with her chronic daily headaches after  2-4 weeks of being off her daily over-the-counter medications. Do not use these medications more than 2 times in a week.  Migraines: Discussed migraine management including acute management and preventative management. We'll start patient on both. Discussed Topamax side effects especially teratogenicity do not get pregnant and use birth control.   As far as your medications are concerned, I would like to suggest: - Stop daily over the counter med use (Tylenol, excedrin, ibuprofen, alleve) and stopped Fioricet. Do not tak emore than 2-3x in one week - For the next 6 days take steroids (medrol) and compazine. Watch for sedation, do not drive unless you know how the compazine affects you. With flexeril,  may increase sedation.  -At onset of Migraine Imitrex and may also take compazine. Please take one tablet at the onset of your headache. If it does not improve the symptoms please take one additional tablet. Do not take more then 2 tablets in 24hrs. Do not take use more then 2 to 3 days in a week. - Qudexy week one , week two  week three . Takes 4-6 weeks for medications to work.  Discussed; To prevent or relieve headaches, try the following:  Cool Compress. Lie down and place a cool compress on your head.   Avoid headache triggers. If certain foods or odors seem to have triggered your migraines in the past, avoid them. A headache diary might help you identify triggers.   Include physical activity in your daily routine. Try a daily walk or other moderate aerobic exercise.   Manage stress. Find healthy ways to cope with the stressors, such as delegating tasks on your to-do list.   Practice relaxation techniques. Try deep breathing, yoga, massage and visualization.   Eat regularly. Eating regularly scheduled meals and maintaining a healthy diet might help prevent headaches. Also, drink plenty of fluids.   Follow a regular sleep schedule. Sleep deprivation might contribute to  headaches  Consider biofeedback. With this mind-body technique, you learn to control certain bodily functions - such as muscle tension, heart rate and blood pressure - to prevent headaches or  reduce headache pain.    Proceed to emergency room if you experience new or worsening symptoms or symptoms do not resolve, if you have new neurologic symptoms or if headache is severe, or for any concerning symptom.   Orders Placed This Encounter  Procedures  . MR BRAIN W WO CONTRAST    Naomie Dean, MD  [facility] [address] [place], [place] [address]  Phone [phone] Fax [phone]

## 2016-12-04 ENCOUNTER — Encounter: Payer: Self-pay | Admitting: Neurology

## 2016-12-06 ENCOUNTER — Other Ambulatory Visit: Payer: BLUE CROSS/BLUE SHIELD

## 2016-12-11 ENCOUNTER — Encounter: Payer: Self-pay | Admitting: Emergency Medicine

## 2016-12-11 ENCOUNTER — Ambulatory Visit (INDEPENDENT_AMBULATORY_CARE_PROVIDER_SITE_OTHER): Payer: BLUE CROSS/BLUE SHIELD | Admitting: Emergency Medicine

## 2016-12-11 VITALS — BP 128/87 | HR 94 | Temp 99.0°F | Resp 18 | Ht 68.5 in | Wt 184.0 lb

## 2016-12-11 DIAGNOSIS — J0100 Acute maxillary sinusitis, unspecified: Secondary | ICD-10-CM | POA: Diagnosis not present

## 2016-12-11 DIAGNOSIS — R0989 Other specified symptoms and signs involving the circulatory and respiratory systems: Secondary | ICD-10-CM | POA: Diagnosis not present

## 2016-12-11 MED ORDER — AZITHROMYCIN 250 MG PO TABS: ORAL_TABLET | ORAL | 0 refills | Status: DC

## 2016-12-11 MED ORDER — PREDNISONE 20 MG PO TABS: 40.0000 mg | ORAL_TABLET | Freq: Every day | ORAL | 0 refills | Status: AC

## 2016-12-11 MED ORDER — PROMETHAZINE-CODEINE 6.25-10 MG/5ML PO SYRP: 5.0000 mL | ORAL_SOLUTION | Freq: Every evening | ORAL | 0 refills | Status: DC | PRN

## 2016-12-11 NOTE — Patient Instructions (Addendum)
     IF you received an x-ray today, you will receive an invoice from [facility]. Please contact [facility] at [phone] with questions or concerns regarding your invoice.   IF you received labwork today, you will receive an invoice from [facility]. Please contact [facility] at [phone] with questions or concerns regarding your invoice.   Our billing staff will not be able to assist you with questions regarding bills from these companies.  You will be contacted with the lab results as soon as they are available. The fastest way to get your results is to activate your My Chart account. Instructions are located on the last page of this paperwork. If you have not heard from us regarding the results in 2 weeks, please contact this office.      Sinusitis, Adult Sinusitis is soreness and inflammation of your sinuses. Sinuses are hollow spaces in the bones around your face. They are located:  Around your eyes.  In the middle of your forehead.  Behind your nose.  In your cheekbones. Your sinuses and nasal passages are lined with a stringy fluid (mucus). Mucus normally drains out of your sinuses. When your nasal tissues get inflamed or swollen, the mucus can get trapped or blocked so air cannot flow through your sinuses. This lets bacteria, viruses, and funguses grow, and that leads to infection. Follow these instructions at home: Medicines   Take, use, or apply over-the-counter and prescription medicines only as told by your doctor. These may include nasal sprays.  If you were prescribed an antibiotic medicine, take it as told by your doctor. Do not stop taking the antibiotic even if you start to feel better. Hydrate and Humidify   Drink enough water to keep your pee (urine) clear or pale yellow.  Use a cool mist humidifier to keep the humidity level in your home above 50%.  Breathe in steam for 10-15 minutes, 3-4 times a day or as told by your doctor. You can do  this in the bathroom while a hot shower is running.  Try not to spend time in cool or dry air. Rest   Rest as much as possible.  Sleep with your head raised (elevated).  Make sure to get enough sleep each night. General instructions   Put a warm, moist washcloth on your face 3-4 times a day or as told by your doctor. This will help with discomfort.  Wash your hands often with soap and water. If there is no soap and water, use hand sanitizer.  Do not smoke. Avoid being around people who are smoking (secondhand smoke).  Keep all follow-up visits as told by your doctor. This is important. Contact a doctor if:  You have a fever.  Your symptoms get worse.  Your symptoms do not get better within 10 days. Get help right away if:  You have a very bad headache.  You cannot stop throwing up (vomiting).  You have pain or swelling around your face or eyes.  You have trouble seeing.  You feel confused.  Your neck is stiff.  You have trouble breathing. This information is not intended to replace advice given to you by your health care provider. Make sure you discuss any questions you have with your health care provider. Document Released: 12/27/2007 Document Revised: 03/05/2016 Document Reviewed: 05/05/2015 Elsevier Interactive Patient Education  2017 [employer]  

## 2016-12-11 NOTE — Progress Notes (Signed)
Alice Mcbride 31 y.o.   Chief Complaint  Patient presents with  . Sinus Problem    x3-4 days  . chest congestion    states it is stuck in her chest    HISTORY OF PRESENT ILLNESS: This is a 31 y.o. 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  HPI   Prior to Admission medications   Medication Sig Start Date End Date Taking? Authorizing Provider  cyclobenzaprine (FLEXERIL) 10 MG tablet Take 1 tablet (10 mg total) by mouth 3 (three) times daily as needed for muscle spasms. 11/13/16  Yes Jeffery, Chelle, PA-C  prochlorperazine (COMPAZINE) 10 MG tablet Take 1 tablet (10 mg total) by mouth every 6-8  hours as needed for nausea or vomiting or migraine/headache 11/21/16  Yes Anson Fret, MD  SUMAtriptan (IMITREX) 100 MG tablet Take 1 tablet (100 mg total) by mouth once as needed. May repeat in 2 hours if headache persists or recurs. 11/21/16  Yes Anson Fret, MD  Topiramate ER (QUDEXY XR) 100 MG CS24 Take 100 mg by mouth at bedtime. 11/21/16  Yes Anson Fret, MD  valACYclovir (VALTREX) 1000 MG tablet Take 2000 mg with onset of fever blister. Repeat in 12 hours. 11/13/16  Yes Porfirio Oar, PA-C    Allergies  Allergen Reactions  . Bee Venom     Patient Active Problem List   Diagnosis Date Noted  . Headache 10/13/2016    Past Medical History:  Diagnosis Date  . Migraine     Past Surgical History:  Procedure Laterality Date  . WISDOM TOOTH EXTRACTION      Social History   Social History  . Marital status: Significant Other    Spouse name: Deanna  . Number of children: 0  . Years of education: EMT certification   Occupational History  . EMT     [employer]   Social History Main Topics  . Smoking status: Former Smoker    Quit date: 2011  . Smokeless tobacco: Never Used  . Alcohol use No  . Drug use: No  . Sexual activity: Yes    Partners: Female    Birth  control/ protection: None   Other Topics Concern  . Not on file   Social History Narrative   Lives with her partner and her partner's two children.   Parents live in [place].   Right-handed   Caffeine: 1-2 sodas per day    Family History  Problem Relation Age of Onset  . Migraines Neg Hx      Review of Systems  Constitutional: Negative for chills, fever and malaise/fatigue.  HENT: Positive for congestion and sinus pain. Negative for sore throat.   Eyes: Negative for blurred vision, double vision, discharge and redness.  Respiratory: Positive for cough and sputum production. Negative for wheezing.   Cardiovascular: Negative for chest pain, palpitations, claudication and leg swelling.  Gastrointestinal: Negative for abdominal pain, blood in stool, constipation, diarrhea, melena, nausea and vomiting.  Genitourinary: Negative for dysuria, flank pain and hematuria.  Musculoskeletal: Negative for myalgias and neck pain.  Skin: Negative for rash.  Neurological: Negative for dizziness, sensory change, focal weakness and headaches.  Endo/Heme/Allergies: Negative.   All other systems reviewed and are negative.  Vitals:   12/11/16 1722  BP: 128/87  Pulse: 94  Resp: 18  Temp: 99 F (37.2 C)     Physical Exam  Constitutional: She is oriented to person, place,  and time. She appears well-developed and well-nourished.  HENT:  Head: Normocephalic and atraumatic.  Right Ear: External ear normal.  Left Ear: External ear normal.  Nose: Nose normal.  Mouth/Throat: Oropharynx is clear and moist. No oropharyngeal exudate.  Eyes: Conjunctivae and EOM are normal. Pupils are equal, round, and reactive to light.  Neck: Normal range of motion. Neck supple. No JVD present. No thyromegaly present.  Cardiovascular: Normal rate, regular rhythm and normal heart sounds.   Pulmonary/Chest: Effort normal and breath sounds normal.  Abdominal: Soft. Bowel sounds are normal. She exhibits no  distension. There is no tenderness.  Musculoskeletal: Normal range of motion.  Lymphadenopathy:    She has no cervical adenopathy.  Neurological: She is alert and oriented to person, place, and time. No sensory deficit. She exhibits normal muscle tone.  Skin: Skin is warm and dry. Capillary refill takes less than 2 seconds. No rash noted.  Psychiatric: She has a normal mood and affect. Her behavior is normal.  Vitals reviewed.    ASSESSMENT & PLAN: Alice Mcbride was seen today for sinus problem and chest congestion.  Diagnoses and all orders for this visit:  Acute non-recurrent maxillary sinusitis  Chest congestion  Other orders -     predniSONE (DELTASONE) 20 MG tablet; Take 2 tablets (40 mg total) by mouth daily with breakfast. -     azithromycin (ZITHROMAX) 250 MG tablet; Sig as indicated -     promethazine-codeine (PHENERGAN WITH CODEINE) 6.25-10 MG/5ML syrup; Take 5 mLs by mouth at bedtime as needed for cough.    Patient Instructions       IF you received an x-ray today, you will receive an invoice from [facility]. Please contact [facility] at [phone] with questions or concerns regarding your invoice.   IF you received labwork today, you will receive an invoice from [place]. Please contact [facility] at [phone] with questions or concerns regarding your invoice.   Our billing staff will not be able to assist you with questions regarding bills from these companies.  You will be contacted with the lab results as soon as they are available. The fastest way to get your results is to activate your My Chart account. Instructions are located on the last page of this paperwork. If you have not heard from [place] regarding the results in 2 weeks, please contact this office.      Sinusitis, Adult Sinusitis is soreness and inflammation of your sinuses. Sinuses are hollow spaces in the bones around your face. They are located:  Around your eyes.  In the  middle of your forehead.  Behind your nose.  In your cheekbones. Your sinuses and nasal passages are lined with a stringy fluid (mucus). Mucus normally drains out of your sinuses. When your nasal tissues get inflamed or swollen, the mucus can get trapped or blocked so air cannot flow through your sinuses. This lets bacteria, viruses, and funguses grow, and that leads to infection. Follow these instructions at home: Medicines   Take, use, or apply over-the-counter and prescription medicines only as told by your doctor. These may include nasal sprays.  If you were prescribed an antibiotic medicine, take it as told by your doctor. Do not stop taking the antibiotic even if you start to feel better. Hydrate and Humidify   Drink enough water to keep your pee (urine) clear or pale yellow.  Use a cool mist humidifier to keep the humidity level in your home above 50%.  Breathe in steam for 10-15 minutes,  3-4 times a day or as told by your doctor. You can do this in the bathroom while a hot shower is running.  Try not to spend time in cool or dry air. Rest   Rest as much as possible.  Sleep with your head raised (elevated).  Make sure to get enough sleep each night. General instructions   Put a warm, moist washcloth on your face 3-4 times a day or as told by your doctor. This will help with discomfort.  Wash your hands often with soap and water. If there is no soap and water, use hand sanitizer.  Do not smoke. Avoid being around people who are smoking (secondhand smoke).  Keep all follow-up visits as told by your doctor. This is important. Contact a doctor if:  You have a fever.  Your symptoms get worse.  Your symptoms do not get better within 10 days. Get help right away if:  You have a very bad headache.  You cannot stop throwing up (vomiting).  You have pain or swelling around your face or eyes.  You have trouble seeing.  You feel confused.  Your neck is stiff.  You  have trouble breathing. This information is not intended to replace advice given to you by your health care provider. Make sure you discuss any questions you have with your health care provider. Document Released: 12/27/2007 Document Revised: 03/05/2016 Document Reviewed: 05/05/2015 Elsevier Interactive Patient Education  2017 Elsevier Inc.      Edwina BarthMiguel Errika Narvaiz, MD [facility]

## 2016-12-26 ENCOUNTER — Telehealth: Payer: Self-pay | Admitting: Neurology

## 2016-12-26 ENCOUNTER — Ambulatory Visit (INDEPENDENT_AMBULATORY_CARE_PROVIDER_SITE_OTHER): Payer: BLUE CROSS/BLUE SHIELD | Admitting: Neurology

## 2016-12-26 ENCOUNTER — Encounter: Payer: Self-pay | Admitting: Neurology

## 2016-12-26 VITALS — BP 124/75 | HR 74 | Ht 68.5 in | Wt 182.6 lb

## 2016-12-26 DIAGNOSIS — G43711 Chronic migraine without aura, intractable, with status migrainosus: Secondary | ICD-10-CM | POA: Diagnosis not present

## 2016-12-26 MED ORDER — ELETRIPTAN HYDROBROMIDE 40 MG PO TABS: 40.0000 mg | ORAL_TABLET | ORAL | 0 refills | Status: DC | PRN

## 2016-12-26 MED ORDER — DIHYDROERGOTAMINE MESYLATE 1 MG/ML IJ SOLN
1.0000 mg | Freq: Once | INTRAMUSCULAR | Status: AC
  Administered 2016-12-26: 1 mg via INTRAVENOUS

## 2016-12-26 MED ORDER — KETOROLAC TROMETHAMINE 60 MG/2ML IM SOLN
60.0000 mg | Freq: Once | INTRAMUSCULAR | Status: AC
  Administered 2016-12-26: 60 mg via INTRAMUSCULAR

## 2016-12-26 NOTE — Progress Notes (Signed)
Toradol 60 mg/2ml administered IM to R deltoid using aseptic technique. Pt tolerated well.Bandaid applied.  

## 2016-12-26 NOTE — Telephone Encounter (Signed)
Patient in the lobby. Stated Dr. Lucia GaskinsAhern told her to come whenever she needed to - complaining of a migraine. Not on schedule.

## 2016-12-26 NOTE — Progress Notes (Signed)
DHE 1 mg/ml   1606 - BP stable. 1/2 ml administered IM to L deltoid using aseptic technique. Pt tolerated well.Bandaid applied.  1633 - BP remains stable. 1/2 ml administered IM to R deltoid using aseptic technique.Bandaid applied.Pt reports feeling much better.

## 2016-12-26 NOTE — Patient Instructions (Signed)
Remember to drink plenty of fluid, eat healthy meals and do not skip any meals. Try to eat protein with a every meal and eat a healthy snack such as fruit or nuts in between meals. Try to keep a regular sleep-wake schedule and try to exercise daily, particularly in the form of walking, 20-30 minutes a day, if you can.   Relpax: Please take one tablet at the onset of your headache. Take with Zipsor.  If it does not improve the symptoms please take one additional Relpax tablet in 2 hours. Do not take more then 2 tablets in 24hrs. Do not take use more then 2 to 3 times in a week.  Our phone number is [phone]. We also have an after hours call service for urgent matters and there is a physician on-call for urgent questions. For any emergencies you know to call 911 or go to the nearest emergency room

## 2016-12-26 NOTE — Progress Notes (Signed)
[facility]    Provider:  Dr Lucia Gaskins Referring Provider: Porfirio Oar, PA-C Primary Care Physician:  Porfirio Oar, PA-C  CC:  Migraines  HPI:  Alice Mcbride is a 31 y.o. female here as an urgent that an appointment for severe migraine. Last week she had 4-5 migraines otherwise she was doing well. Nothing changed. She had migraines wed, fri and yesterday and she has had a migraine since yesterday which has been the severest in years. She went off of the daily meds and no longer has rebound or medication overuse headache. She is taking Topamax extended release with good results and no side effects discussed teratogenicity do not get pregnant on this medication. She has tried everything since yesterday, Imitrex, Compazine, Flexeril. Imitrex didn;t help. Compazine didn;t help. Flexeril didn;t help. Imitrex did not help. Migraines start slow. She took the imitrex right away and it did not help. She is on 100mg  topamax. Discussed at this point we'll change her acute management so that hopefully migraines don't progress like this. We'll changed to Relpax. Can take with Flexeril.  HPI:  Alice Mcbride is a 31 y.o. female here as a referral from Dr. Leotis Shames for migraines worsening over the last 6 months in frequency and severity. 2 months ago she bent over and felt a sharp pain on the left side of her head and it happened again and it stayed. On the left side of the head. Turned into a constant migraine She had a tramadol shot and steroid shot at the end which helped for 6 hours and then came back. She went to [facility] on 3/23. [name] started flexeril at night to try and help her sleep, she was worn out due to the pain.The Fioricet helps sometimes She is taking Fioricet and has had the bottle refilled.   Her migraines include light sensitivity, nausea and vomiting, unilateral on either side or behind the eyes is very common, throbbing and pounding, smells don;t bother  her but she is sensitive to light and sound. Unknown triggers. No food triggers. Most of the time she sleep well and get a goods night sleep. The flexeril helping her sleep. The headaches are waking her up for the last 2 months. She is having blurry vision. No snoring. She has daily headaches. Continuous headaches. At least 1/2 are migrainous. Headaches are worse laying down. Very tired. For the last 6 months its worsening and she is having headaches at least 12-15 a month.   Reviewed notes, labs and imaging from outside physicians, which showed:   Reviewed labs CMP and CBC normal.  Reviewed primary care notes. 3 weeks ago after getting out of the shower she leaned over to dry her hair and when she stood up she felt a sharp pain in the left side of the head behind the ear that lasted about 30 minutes. Recurred a few days later with similar movement same pain and location. Has had pain constantly since then. Most of the pain is not the back of her head and at the top of the neck. Rates the pain as 5 out of 10 but can be 8 out of 10 at its worst. Ibuprofen helps a little, reducing the pain to 3 out of 10. Quality is throbbing ache that occasional sharp pain. Several days previous to last primary care appointment she presented to another facility, received Augmentin, steroid injection and Toradol injection for presumed sinusitis even though she didn't feel like she had any symptoms. She had  minimal improvement. Rapid position changes cause some dizziness. She was treated with a muscle relaxant and Fioricet.  Review of Systems: Patient complains of symptoms per HPI as well as the following symptoms: No CP, no SOB. Pertinent negatives per HPI. All others negative.   Social History   Social History  . Marital status: Significant Other    Spouse name: Deanna  . Number of children: 0  . Years of education: EMT certification   Occupational History  . EMT     [employer]   Social  History Main Topics  . Smoking status: Former Smoker    Quit date: 2011  . Smokeless tobacco: Never Used  . Alcohol use No  . Drug use: No  . Sexual activity: Yes    Partners: Female    Birth control/ protection: None   Other Topics Concern  . Not on file   Social History Narrative   Lives with her partner and her partner's two children.   Parents live in [place].   Right-handed   Caffeine: 1-2 sodas per day    Family History  Problem Relation Age of Onset  . Migraines Neg Hx     Past Medical History:  Diagnosis Date  . Migraine     Past Surgical History:  Procedure Laterality Date  . WISDOM TOOTH EXTRACTION      Current Outpatient Prescriptions  Medication Sig Dispense Refill  . cyclobenzaprine (FLEXERIL) 10 MG tablet Take 1 tablet (10 mg total) by mouth 3 (three) times daily as needed for muscle spasms. 30 tablet 0  . prochlorperazine (COMPAZINE) 10 MG tablet Take 1 tablet (10 mg total) by mouth every 6-8  hours as needed for nausea or vomiting or migraine/headache 30 tablet 3  . SUMAtriptan (IMITREX) 100 MG tablet Take 1 tablet (100 mg total) by mouth once as needed. May repeat in 2 hours if headache persists or recurs. 10 tablet 12  . Topiramate ER (QUDEXY XR) 100 MG CS24 Take 100 mg by mouth at bedtime. 30 each 12  . valACYclovir (VALTREX) 1000 MG tablet Take 2000 mg with onset of fever blister. Repeat in 12 hours. 30 tablet 1   No current facility-administered medications for this visit.     Allergies as of 12/26/2016 - Review Complete 12/26/2016  Allergen Reaction Noted  . Bee venom  11/21/2016    Vitals: BP 119/78   Pulse 77   Ht 5' 8.5" (1.74 m)   Wt 182 lb 9.6 oz (82.8 kg)   BMI 27.36 kg/m  Last Weight:  Wt Readings from Last 1 Encounters:  12/26/16 182 lb 9.6 oz (82.8 kg)   Last Height:   Ht Readings from Last 1 Encounters:  12/26/16 5' 8.5" (1.74 m)    Physical exam: Exam: Gen: NAD, conversant, well nourised, obese, well groomed                      CV: RRR, no MRG. No Carotid Bruits. No peripheral edema, warm, nontender Eyes: Conjunctivae clear without exudates or hemorrhage  Neuro: Detailed Neurologic Exam  Speech:    Speech is normal; fluent and spontaneous with normal comprehension.  Cognition:    The patient is oriented to person, place, and time;     recent and remote memory intact;     language fluent;     normal attention, concentration,     fund of knowledge Cranial Nerves:    The pupils are equal, round, and reactive to light.  The fundi are normal and spontaneous venous pulsations are present. Visual fields are full to finger confrontation. Extraocular movements are intact. Trigeminal sensation is intact and the muscles of mastication are normal. The face is symmetric. The palate elevates in the midline. Hearing intact. Voice is normal. Shoulder shrug is normal. The tongue has normal motion without fasciculations.   Coordination:    Normal finger to nose and heel to shin. Normal rapid alternating movements.   Gait:    Heel-toe and tandem gait are normal.   Motor Observation:    No asymmetry, no atrophy, and no involuntary movements noted. Tone:    Normal muscle tone.    Posture:    Posture is normal. normal erect    Strength:    Strength is V/V in the upper and lower limbs.      Sensation: intact to LT     Reflex Exam:  DTR's:    Deep tendon reflexes in the upper and lower extremities are normal bilaterally.   Toes:    The toes are downgoing bilaterally.   Clonus:    Clonus is absent.      Assessment/Plan:  Patient here with migraines with severe migraine since yesterday. She stopped all her over-the-counter medications and feels better without rebound, medication overuse headaches. She's been doing well on the topiramate. We'll change her acute migraine management and give her Relpax and can be a at onset. Continue topiramate. Provided Toradol shot and DHE injections today. Patient's  headache went from a 7 to a 1 out of 10. Informed her she should call his first thing in the morning if headache return and we will do an IV migraine infusion here in our infusion center.  Naomie Dean, MD  [facility] [address] [place], [place] [address]  Phone [phone] Fax [phone]  A total of 25 minutes was spent face-to-face with this patient. Over half this time was spent on counseling patient on the intractable migraine acute diagnosis and different diagnostic and therapeutic options available.

## 2016-12-26 NOTE — Telephone Encounter (Signed)
Can she come in for migraine cocktail? Ask Inetta Fermoina is they have time at 3pm and call patient. But also tell Inetta Fermoina if she is not in significant migraine then I will just order a toradol shot or do nerve blocks.

## 2016-12-26 NOTE — Telephone Encounter (Signed)
Pt said she was told to call to schedule an appointment or to just come in if she has a bad migraine and unable to get rid of it.  Pt said she has taken all medications and even had to call into work to be off due to migraine.  Pt said she did not want to just show up but would very much like to come in for a shot or some type of treatment, there has been vomiting as well. Please call

## 2016-12-27 ENCOUNTER — Telehealth: Payer: Self-pay

## 2016-12-27 ENCOUNTER — Encounter: Payer: Self-pay | Admitting: Neurology

## 2016-12-27 NOTE — Telephone Encounter (Signed)
Pt enrollment and rx order form for [place], completed, signed and faxed to [facility].

## 2016-12-28 ENCOUNTER — Other Ambulatory Visit: Payer: Self-pay | Admitting: Neurology

## 2016-12-28 MED ORDER — RIZATRIPTAN BENZOATE 10 MG PO TABS: 10.0000 mg | ORAL_TABLET | ORAL | 11 refills | Status: DC | PRN

## 2016-12-31 ENCOUNTER — Encounter: Payer: Self-pay | Admitting: Neurology

## 2017-01-01 ENCOUNTER — Encounter: Payer: Self-pay | Admitting: Neurology

## 2017-02-07 ENCOUNTER — Encounter: Payer: Self-pay | Admitting: Neurology

## 2017-02-07 MED ORDER — TOPIRAMATE ER 150 MG PO SPRINKLE CAP24: 150.0000 mg | EXTENDED_RELEASE_CAPSULE | Freq: Every day | ORAL | 11 refills | Status: DC

## 2017-02-16 ENCOUNTER — Encounter: Payer: Self-pay | Admitting: Neurology

## 2017-02-16 MED ORDER — TOPIRAMATE ER 150 MG PO SPRINKLE CAP24: 150.0000 mg | EXTENDED_RELEASE_CAPSULE | Freq: Every day | ORAL | 11 refills | Status: DC

## 2017-03-27 ENCOUNTER — Encounter: Payer: Self-pay | Admitting: Neurology

## 2017-03-27 ENCOUNTER — Telehealth: Payer: Self-pay

## 2017-03-27 NOTE — Telephone Encounter (Signed)
Completed PA for topiramate ER 150 mg caps per request from pt and [facility]. [facility]. Should have a determination in 72 hours.

## 2017-03-29 MED ORDER — TOPIRAMATE ER 150 MG PO SPRINKLE CAP24: 150.0000 mg | EXTENDED_RELEASE_CAPSULE | Freq: Every day | ORAL | 1 refills | Status: DC

## 2017-03-29 NOTE — Telephone Encounter (Signed)
I called [facility], the pa for topiramate ER 150 mg is still pending.  Dr. Lucia GaskinsAhern gave me samples of topiramate  ER 150mg  to give to Alice Mcbride to use while PA is pending. Both samples have LOT:[id number], EXP:10/19, and NDC: [phone].  I called Alice Mcbride, advised her of this, and reminded her to take the topiramate ER 150 mg capsule 1 tablet by mouth at bedtime. Alice Mcbride verbalized understanding.  I gave Alice Mcbride clinic hours and she will come by and pick up the samples. RX printed and attached to boxes of topiramate ER.

## 2017-04-04 NOTE — Telephone Encounter (Signed)
Received this notice from [facility] and [facility]mymeds : "Request Reference Number: [id number]. TOPIRAMATE CAP ER 150MG  is denied for not meeting the prior authorization requirement(s). For further questions, call [phone]) [phone]. Appeals are not supported through ePA. Please refer to the fax case notice for appeals information and instructions.

## 2017-04-04 NOTE — Telephone Encounter (Signed)
No we need to call the Engineer, [employer] nurse manager. Since the PA is rejected, she should get a year for free or at least several more months. Would you call [place] at [phone] and discuss this patient? Thanks  [place], this is also the number to call about the other patient we discussed, maybe you can talk to TroyKristin and she can discuss both patients thanks

## 2017-04-04 NOTE — Telephone Encounter (Signed)
Apologies I thought this was for [employer]. Would you let patient know that the Topiramate ER was not approved but we can send in a prescription for the generic Topiramate taken the exact same dose once daily at bedtime thanks. If she agrees order the topiramate or I can order it. Verify her dose. thanks

## 2017-04-04 NOTE — Telephone Encounter (Signed)
I called pt, I advised her that the pa from topiramate ER 150 mg was denied by her insurance and that Dr. Lucia GaskinsAhern recommends generic topirate at the same dose to be taken at bedtime instead. Pt is agreeable to this and verified that currently she is taking topiramate ER150 mg at bedtime. I advised her that topiramate 150 mg will be sent to her pharmacy. Pt verbalized understanding.

## 2017-04-05 MED ORDER — TOPIRAMATE 50 MG PO TABS: 150.0000 mg | ORAL_TABLET | Freq: Every day | ORAL | 1 refills | Status: DC

## 2017-04-05 NOTE — Telephone Encounter (Signed)
Received verbal order from Dr. Lucia GaskinsAhern to order topiramate 150 mg qhs using 3 50mg  tablets. Order placed.

## 2017-04-05 NOTE — Addendum Note (Signed)
Addended by: Geronimo RunningINKINS, Mariza Bourget A on: 04/05/2017 07:32 AM   Modules accepted: Orders

## 2017-04-10 ENCOUNTER — Telehealth: Payer: Self-pay | Admitting: *Deleted

## 2017-04-10 NOTE — Telephone Encounter (Signed)
PA for Topiramate ER  capsules #30/30 completed by phone with [facility], phone# [phone]. Dx: Chronic Migraine (G43.909).  Tried and failed meds: Topamax , 3 tabs at bedtime, Topamax ER (Qudexy XR) , Topamax ER (Qudexy) , Fioricet, Toradol, Medrol dose pk, Maxalt , Imitrex . Per ins. policy, pt. should also try and fail Keppra, Depakote, Tegretol and Dilantin before Topiramate is approved, however, pt. has been stable on Topiramate for approx. 6 mos. and has a documented positive result with it./fim

## 2017-04-18 NOTE — Telephone Encounter (Signed)
Received fax request from [place] [facility] [address] to complete PA on topiramate ER  Cap. Baxter Hire, RN already completed PA. I also called insurance again and verified again that PA was denied. Rx topiramate  tablet (3 tablets at bedtime) qty 90, 1 refill to replace it. Pt aware.  I fax notification to pharmacy to d/c rx topiramate ER  cap. Fax: [phone]. Received fax confirmation.

## 2017-06-21 ENCOUNTER — Encounter: Payer: Self-pay | Admitting: Neurology

## 2017-07-20 ENCOUNTER — Other Ambulatory Visit: Payer: Self-pay

## 2017-07-20 ENCOUNTER — Encounter: Payer: Self-pay | Admitting: Physician Assistant

## 2017-07-20 ENCOUNTER — Other Ambulatory Visit: Payer: Self-pay | Admitting: Neurology

## 2017-07-20 DIAGNOSIS — G4489 Other headache syndrome: Secondary | ICD-10-CM

## 2017-07-20 MED ORDER — CYCLOBENZAPRINE HCL 10 MG PO TABS: 10.0000 mg | ORAL_TABLET | Freq: Three times a day (TID) | ORAL | 0 refills | Status: DC | PRN

## 2017-09-25 ENCOUNTER — Other Ambulatory Visit: Payer: Self-pay | Admitting: Neurology

## 2017-10-25 ENCOUNTER — Encounter: Payer: Self-pay | Admitting: Physician Assistant

## 2017-11-14 ENCOUNTER — Other Ambulatory Visit: Payer: Self-pay | Admitting: Neurology

## 2017-11-14 NOTE — Telephone Encounter (Addendum)
Attempted to call pt to discuss this medication refill as she was previously given samples of Qudexy XR but has since been prescribed Topamax 150 mg due to PA being denied. Number was not working number/unavailable. Called her father Scotty (on [place] He gave the pt's new phone number, [phone]. Called pt and discussed the medication. She stated that she was unable to get the Topiramate ER even with the copay card because of insurance denial. She had used the samples of Qudexy XR given to her in Sept. Since then she has been taking Topiramate IR 50 mg tablet, 3 tablets at bedtime as a replacement and had to change pharmacies recently because she could not get a full refill. Informed pt RN will send in another refill of the Topiramate IR. Pt verbalized appreciation.  Refilled Topiramate 50 mg tablets per request. Also d/c Topiramate ER in chart for clarification as pharmacy had already been faxed to d/c this in Sept by Marlis EdelsonEmma RN.

## 2017-12-17 ENCOUNTER — Other Ambulatory Visit: Payer: Self-pay | Admitting: Neurology

## 2017-12-24 ENCOUNTER — Encounter: Payer: Self-pay | Admitting: Neurology

## 2017-12-28 ENCOUNTER — Encounter: Payer: Self-pay | Admitting: Neurology

## 2017-12-28 ENCOUNTER — Ambulatory Visit (INDEPENDENT_AMBULATORY_CARE_PROVIDER_SITE_OTHER): Payer: 59 | Admitting: Neurology

## 2017-12-28 VITALS — BP 120/73 | HR 85 | Ht 69.0 in | Wt 181.0 lb

## 2017-12-28 DIAGNOSIS — R110 Nausea: Secondary | ICD-10-CM

## 2017-12-28 DIAGNOSIS — G4489 Other headache syndrome: Secondary | ICD-10-CM | POA: Diagnosis not present

## 2017-12-28 DIAGNOSIS — G43711 Chronic migraine without aura, intractable, with status migrainosus: Secondary | ICD-10-CM

## 2017-12-28 DIAGNOSIS — G43709 Chronic migraine without aura, not intractable, without status migrainosus: Secondary | ICD-10-CM | POA: Diagnosis not present

## 2017-12-28 DIAGNOSIS — M7918 Myalgia, other site: Secondary | ICD-10-CM | POA: Diagnosis not present

## 2017-12-28 MED ORDER — ERENUMAB-AOOE 140 MG/ML ~~LOC~~ SOAJ
140.0000 mg | Freq: Every day | SUBCUTANEOUS | 11 refills | Status: DC
Start: 2017-12-28 — End: 2021-08-11

## 2017-12-28 NOTE — Patient Instructions (Signed)
Erenumab: Patient drug information Copyright [id number] [employer] All rights reserved. (For additional information see "Erenumab: Drug information") Brand Names: [place]  Aimovig  What is this drug used for?   It is used to prevent migraine headaches.  What do I need to tell my doctor BEFORE I take this drug?   If you have an allergy to this drug or any part of this drug.   If you are allergic to any drugs like this one, any other drugs, foods, or other substances. Tell your doctor about the allergy and what signs you had, like rash; hives; itching; shortness of breath; wheezing; cough; swelling of face, lips, tongue, or throat; or any other signs.   This drug may interact with other drugs or health problems.   Tell your doctor and pharmacist about all of your drugs (prescription or OTC, natural products, vitamins) and health problems. You must check to make sure that it is safe for you to take this drug with all of your drugs and health problems. Do not start, stop, or change the dose of any drug without checking with your doctor.  What are some things I need to know or do while I take this drug?   Tell all of your health care providers that you take this drug. This includes your doctors, nurses, pharmacists, and dentists.   If you have a latex allergy, talk with your doctor.   Tell your doctor if you are pregnant or plan on getting pregnant. You will need to talk about the benefits and risks of using this drug while you are pregnant.   Tell your doctor if you are breast-feeding. You will need to talk about any risks to your baby.  What are some side effects that I need to call my doctor about right away?   WARNING/CAUTION: Even though it may be rare, some people may have very bad and sometimes deadly side effects when taking a drug. Tell your doctor or get medical help right away if you have any of the following signs or symptoms that may be related to a very bad side effect:   Signs of an  allergic reaction, like rash; hives; itching; red, swollen, blistered, or peeling skin with or without fever; wheezing; tightness in the chest or throat; trouble breathing, swallowing, or talking; unusual hoarseness; or swelling of the mouth, face, lips, tongue, or throat.  What are some other side effects of this drug?   All drugs may cause side effects. However, many people have no side effects or only have minor side effects. Call your doctor or get medical help if any of these side effects or any other side effects bother you or do not go away:   Redness or swelling where the shot is given.   Pain where the shot was given.   Constipation.   These are not all of the side effects that may occur. If you have questions about side effects, call your doctor. Call your doctor for medical advice about side effects.   You may report side effects to your national health agency.  How is this drug best taken?   Use this drug as ordered by your doctor. Read all information given to you. Follow all instructions closely.   It is given as a shot into the fatty part of the skin on the top of the thigh, belly area, or upper arm.   If you will be giving yourself the shot, your doctor or nurse will teach you  how to give the shot.   Follow how to use as you have been told by the doctor or read the package insert.   If stored in a refrigerator, let this drug come to room temperature before using it. Leave it at room temperature for at least 30 minutes. Do not heat this drug.   Protect from heat and sunlight.   Do not shake.   Do not give into skin that is irritated, bruised, red, infected, or scarred.   Do not use if the solution is cloudy, leaking, or has particles.   Do not use if solution changes color.   Throw away after using. Do not use the device more than 1 time.   Throw away needles in a needle/sharp disposal box. Do not reuse needles or other items. When the box is full, follow all local rules for  getting rid of it. Talk with a doctor or pharmacist if you have any questions.  What do I do if I miss a dose?   Take a missed dose as soon as you think about it.   After taking a missed dose, start a new schedule based on when the dose is taken.  How do I store and/or throw out this drug?   Store in a refrigerator. Do not freeze.   Store in the carton to protect from light.   Do not use if it has been frozen.   If you drop this drug on a hard surface, do not use it.   If needed, you may store at room temperature for up to 7 days. Write down the date you take this drug out of the refrigerator. If stored at room temperature and not used within 7 days, throw this drug away.   Do not put this drug back in the refrigerator after it has been stored at room temperature.   Keep all drugs in a safe place. Keep all drugs out of the reach of children and pets.   Throw away unused or expired drugs. Do not flush down a toilet or pour down a drain unless you are told to do so. Check with your pharmacist if you have questions about the best way to throw out drugs. There may be drug take-back programs in your area.  General drug facts   If your symptoms or health problems do not get better or if they become worse, call your doctor.   Do not share your drugs with others and do not take anyone else's drugs.   Keep a list of all your drugs (prescription, natural products, vitamins, OTC) with you. Give this list to your doctor.   Talk with the doctor before starting any new drug, including prescription or OTC, natural products, or vitamins.   Some drugs may have another patient information leaflet. If you have any questions about this drug, please talk with your doctor, nurse, pharmacist, or other health care provider.   If you think there has been an overdose, call your poison control center or get medical care right away. Be ready to tell or show what was taken, how much, and when it happened.

## 2017-12-28 NOTE — Progress Notes (Signed)
[facility]    Provider:  Dr Lucia Gaskins Referring Provider: Porfirio Oar, PA-C Primary Care Physician:  Porfirio Oar, PA-C  CC:  Migraines  Interval history 12/28/2017: she still has has 15 headache days. She has 8 migraines and 5 can be severe where she in bed has to take flexeril and sleep. Takes Topiramate 150mg  at bedtime. Helped with severity and frequency of migraines. Acute medication works. But still suffering from significant migraines. Discussed options such as increasing current meds, botox for migraines, trying other preventatives and cgrp. She does not want to take another med and not interested in botox would like cgrp. Will order but advised that in a year se may not qualify if we don;t try other medications. She would just like to try cgrp for now. She has accompanied nausea and neck tightness  Tried: topiramate IR, fioricet, flexeril, compazine, maxalt, imitrex, qudexy  HPI:  Alice Mcbride is a 32 y.o. female here as an urgent that an appointment for severe migraine. Last week she had 4-5 migraines otherwise she was doing well. Nothing changed. She had migraines wed, fri and yesterday and she has had a migraine since yesterday which has been the severest in years. She went off of the daily meds and no longer has rebound or medication overuse headache. She is taking Topamax extended release with good results and no side effects discussed teratogenicity do not get pregnant on this medication. She has tried everything since yesterday, Imitrex, Compazine, Flexeril. Imitrex didn;t help. Compazine didn;t help. Flexeril didn;t help. Imitrex did not help. Migraines start slow. She took the imitrex right away and it did not help. She is on 100mg  topamax. Discussed at this point we'll change her acute management so that hopefully migraines don't progress like this. We'll changed to Relpax. Can take with Flexeril.  HPI:  Alice Mcbride is a 32 y.o. female here as a  referral from Dr. Leotis Shames for migraines worsening over the last 6 months in frequency and severity. 2 months ago she bent over and felt a sharp pain on the left side of her head and it happened again and it stayed. On the left side of the head. Turned into a constant migraine She had a tramadol shot and steroid shot at the end which helped for 6 hours and then came back. She went to [facility] on 3/23. [name] started flexeril at night to try and help her sleep, she was worn out due to the pain.The Fioricet helps sometimes She is taking Fioricet and has had the bottle refilled.   Her migraines include light sensitivity, nausea and vomiting, unilateral on either side or behind the eyes is very common, throbbing and pounding, smells don;t bother her but she is sensitive to light and sound. Unknown triggers. No food triggers. Most of the time she sleep well and get a goods night sleep. The flexeril helping her sleep. The headaches are waking her up for the last 2 months. She is having blurry vision. No snoring. She has daily headaches. Continuous headaches. At least 1/2 are migrainous. Headaches are worse laying down. Very tired. For the last 6 months its worsening and she is having headaches at least 12-15 a month.   Reviewed notes, labs and imaging from outside physicians, which showed:   Reviewed labs CMP and CBC normal.  Reviewed primary care notes. 3 weeks ago after getting out of the shower she leaned over to dry her hair and when she stood up she felt a  sharp pain in the left side of the head behind the ear that lasted about 30 minutes. Recurred a few days later with similar movement same pain and location. Has had pain constantly since then. Most of the pain is not the back of her head and at the top of the neck. Rates the pain as 5 out of 10 but can be 8 out of 10 at its worst. Ibuprofen helps a little, reducing the pain to 3 out of 10. Quality is throbbing ache that occasional sharp pain.  Several days previous to last primary care appointment she presented to another facility, received Augmentin, steroid injection and Toradol injection for presumed sinusitis even though she didn't feel like she had any symptoms. She had minimal improvement. Rapid position changes cause some dizziness. She was treated with a muscle relaxant and Fioricet.  Review of Systems: Patient complains of symptoms per HPI as well as the following symptoms: Migraines, dizziness. No CP, no SOB. Pertinent negatives per HPI. All others negative.   Social History   Socioeconomic History  . Marital status: Significant Other    Spouse name: Deanna  . Number of children: 0  . Years of education: EMT certification  . Highest education level: Not on file  Occupational History  . Occupation: EMT    Comment: Educational psychologist  Social Needs  . Financial resource strain: Not on file  . Food insecurity:    Worry: Not on file    Inability: Not on file  . Transportation needs:    Medical: Not on file    Non-medical: Not on file  Tobacco Use  . Smoking status: Former Smoker    Types: Cigarettes    Last attempt to quit: 2011    Years since quitting: 8.4  . Smokeless tobacco: Never Used  Substance and Sexual Activity  . Alcohol use: No  . Drug use: No  . Sexual activity: Yes    Partners: Female    Birth control/protection: None  Lifestyle  . Physical activity:    Days per week: Not on file    Minutes per session: Not on file  . Stress: Not on file  Relationships  . Social connections:    Talks on phone: Not on file    Gets together: Not on file    Attends religious service: Not on file    Active member of club or organization: Not on file    Attends meetings of clubs or organizations: Not on file    Relationship status: Not on file  . Intimate partner violence:    Fear of current or ex partner: Not on file    Emotionally abused: Not on file    Physically abused: Not on file    Forced  sexual activity: Not on file  Other Topics Concern  . Not on file  Social History Narrative   Lives with her partner and her partner's two children.   Parents live in [place].   Right-handed   Caffeine: none     Family History  Problem Relation Age of Onset  . Migraines Neg Hx     Past Medical History:  Diagnosis Date  . Migraine     Past Surgical History:  Procedure Laterality Date  . WISDOM TOOTH EXTRACTION      Current Outpatient Medications  Medication Sig Dispense Refill  . cyclobenzaprine (FLEXERIL) 10 MG tablet Take 1 tablet (10 mg total) by mouth 3 (three) times daily as needed for muscle spasms. 30 tablet  11  . prochlorperazine (COMPAZINE) 10 MG tablet Take 1 tablet (10 mg total) by mouth every 6-8  hours as needed for nausea or vomiting or migraine/headache 30 tablet 11  . rizatriptan (MAXALT) 10 MG tablet Take 1 tablet (10 mg total) by mouth as needed for migraine. May repeat in 2 hours if needed. Max 2 doses in one day. 10 tablet 11  . topiramate (TOPAMAX) 50 MG tablet Take 3 tablets (150 mg total) by mouth at bedtime. 90 tablet 11  . Erenumab-aooe (AIMOVIG) 140 MG/ML SOAJ Inject 140 mg into the skin at bedtime. 1 pen 11  . valACYclovir (VALTREX) 1000 MG tablet Take 2000 mg with onset of fever blister. Repeat in 12 hours. 30 tablet 1   No current facility-administered medications for this visit.     Allergies as of 12/28/2017 - Review Complete 12/28/2017  Allergen Reaction Noted  . Bee venom  11/21/2016    Vitals: BP 120/73 (BP Location: Left Arm, Patient Position: Sitting)   Pulse 85   Ht 5\' 9"  (1.753 m)   Wt 181 lb (82.1 kg)   BMI 26.73 kg/m  Last Weight:  Wt Readings from Last 1 Encounters:  12/28/17 181 lb (82.1 kg)   Last Height:   Ht Readings from Last 1 Encounters:  12/28/17 5\' 9"  (1.753 m)    Physical exam: Exam: Gen: NAD, conversant, well nourised, well groomed                     CV: RRR, no MRG. No Carotid Bruits. No peripheral  edema, warm, nontender Eyes: Conjunctivae clear without exudates or hemorrhage  Neuro: Detailed Neurologic Exam  Speech:    Speech is normal; fluent and spontaneous with normal comprehension.  Cognition:    The patient is oriented to person, place, and time;     recent and remote memory intact;     language fluent;     normal attention, concentration,     fund of knowledge Cranial Nerves:    The pupils are equal, round, and reactive to light. The fundi are normal and spontaneous venous pulsations are present. Visual fields are full to finger confrontation. Extraocular movements are intact. Trigeminal sensation is intact and the muscles of mastication are normal. The face is symmetric. The palate elevates in the midline. Hearing intact. Voice is normal. Shoulder shrug is normal. The tongue has normal motion without fasciculations.   Coordination:    Normal finger to nose and heel to shin. Normal rapid alternating movements.   Gait:    Heel-toe and tandem gait are normal.   Motor Observation:    No asymmetry, no atrophy, and no involuntary movements noted. Tone:    Normal muscle tone.    Posture:    Posture is normal. normal erect    Strength:    Strength is V/V in the upper and lower limbs.      Sensation: intact to LT     Reflex Exam:  DTR's:    Deep tendon reflexes in the upper and lower extremities are normal bilaterally.   Toes:    The toes are downgoing bilaterally.   Clonus:    Clonus is absent.       Assessment/Plan:  Patient here with chronic migraines. Discussed choices, She does not want to take another med and not interested in botox would like cgrp. Will order but advised that in a year se may not qualify if we don;t try other medications. She would just like  to try cgrp for now. Will order, she can use the free copay card but may not qualify after this period she understands. Also discussed headaches, environmental triggers, sleep, weight, exercise, other  oral preventatives, botox. Discussed labwork today, doesn;t appear she has had any in some time (tsh in 2015 normal).   - start Aimovig - Continue Topamax and maxalt - compazine for nausea prn - flexeril for muscle spasms and neck pain, myofascial cervical pain syndrome - refill meds - labs today  Orders Placed This Encounter  Procedures  . TSH  . CBC  . Comprehensive metabolic panel      Naomie DeanAntonia Mignonne Afonso, MD  [facility] [address] [place], [place] [id number]  Phone [phone] Fax [phone]  A total of 25 minutes was spent face-to-face with this patient. Over half this time was spent on counseling patient on the chronic migraine, nausea, myofascial cervical pain and therapeutic options available.

## 2017-12-30 ENCOUNTER — Encounter: Payer: Self-pay | Admitting: Neurology

## 2017-12-30 DIAGNOSIS — G43709 Chronic migraine without aura, not intractable, without status migrainosus: Secondary | ICD-10-CM | POA: Insufficient documentation

## 2017-12-30 MED ORDER — TOPIRAMATE 50 MG PO TABS: 150.0000 mg | ORAL_TABLET | Freq: Every day | ORAL | 11 refills | Status: DC

## 2017-12-30 MED ORDER — RIZATRIPTAN BENZOATE 10 MG PO TABS: 10.0000 mg | ORAL_TABLET | ORAL | 11 refills | Status: DC | PRN

## 2017-12-30 MED ORDER — CYCLOBENZAPRINE HCL 10 MG PO TABS: 10.0000 mg | ORAL_TABLET | Freq: Three times a day (TID) | ORAL | 11 refills | Status: AC | PRN

## 2017-12-30 MED ORDER — PROCHLORPERAZINE MALEATE 10 MG PO TABS: ORAL_TABLET | ORAL | 11 refills | Status: DC

## 2018-01-01 ENCOUNTER — Telehealth: Payer: Self-pay

## 2018-01-01 NOTE — Telephone Encounter (Signed)
We received a prior authorization request for the Aimovig 140mg /mL dose. I have completed and submitted the PA on Cover My Meds and should have a determination within 48-72 hours.  Cover My Meds Key: [id number]

## 2018-01-07 NOTE — Telephone Encounter (Signed)
Per health plan criteria for Aimovig, coverage is denied. Aimovig can be approved if: Your patient has a trial and failure of at least two months, contraindication, or intolerance to one of the following prophylactic therapies: (i) Amitriptyline (Elavil). (ii) One of the following beta-blockers: Atenolol, metoprolol, nadolol, propranolol, or timolol. (iii) Divalproex sodium (Depakote/Depakote ER). (iv) Venlafaxine (Effexor/Effexor XR). (v) OnabotulinumtoxinA (Botox).  Dr. Lucia GaskinsAhern, what would you like to do?

## 2018-01-14 NOTE — Telephone Encounter (Signed)
Ask her to use the copay card please thanks

## 2018-01-14 NOTE — Telephone Encounter (Signed)
Called pt. She was able to use her co-pay card the first time to get her Aimovig. RN encouraged to keep using this as it will still work despite insurance denial. Pt verbalized understanding and appreciation.

## 2018-02-04 ENCOUNTER — Encounter: Payer: Self-pay | Admitting: Neurology

## 2018-02-15 ENCOUNTER — Other Ambulatory Visit: Payer: Self-pay | Admitting: Neurology

## 2018-02-15 ENCOUNTER — Encounter: Payer: Self-pay | Admitting: Neurology

## 2018-02-15 ENCOUNTER — Telehealth: Payer: Self-pay | Admitting: Neurology

## 2018-02-15 DIAGNOSIS — G43711 Chronic migraine without aura, intractable, with status migrainosus: Secondary | ICD-10-CM

## 2018-02-15 NOTE — Addendum Note (Signed)
Addended by: Tamera StandsHINNANT, Megha Agnes D on: 02/15/2018 10:54 AM   Modules accepted: Orders

## 2018-02-15 NOTE — Telephone Encounter (Signed)
Called the patient and made her aware we saw her mychart message. Offered the pt an infusion. She states she has a driver and they can pick drop her off in next 25 min. Sent orders to [facility] in infusion.

## 2018-02-18 LAB — COMPREHENSIVE METABOLIC PANEL
ALK PHOS: 68 IU/L (ref 39–117)
ALT: 16 IU/L (ref 0–32)
AST: 16 IU/L (ref 0–40)
Albumin/Globulin Ratio: 1.7 (ref 1.2–2.2)
Albumin: 4.4 g/dL (ref 3.5–5.5)
BUN/Creatinine Ratio: 13 (ref 9–23)
BUN: 12 mg/dL (ref 6–20)
Bilirubin Total: 0.7 mg/dL (ref 0.0–1.2)
CALCIUM: 8.9 mg/dL (ref 8.7–10.2)
CO2: 21 mmol/L (ref 20–29)
CREATININE: 0.93 mg/dL (ref 0.57–1.00)
Chloride: 102 mmol/L (ref 96–106)
GFR calc Af Amer: 95 mL/min/{1.73_m2} (ref >59)
GFR calc non Af Amer: 82 mL/min/{1.73_m2} (ref >59)
GLOBULIN, TOTAL: 2.6 g/dL (ref 1.5–4.5)
GLUCOSE: 75 mg/dL (ref 65–99)
Potassium: 4 mmol/L (ref 3.5–5.2)
SODIUM: 137 mmol/L (ref 134–144)
Total Protein: 7 g/dL (ref 6.0–8.5)

## 2018-02-18 LAB — CBC
HEMOGLOBIN: 13.8 g/dL (ref 11.1–15.9)
Hematocrit: 39.7 % (ref 34.0–46.6)
MCH: 31.7 pg (ref 26.6–33.0)
MCHC: 34.8 g/dL (ref 31.5–35.7)
MCV: 91 fL (ref 79–97)
PLATELETS: 313 10*3/uL (ref 150–450)
RBC: 4.36 x10E6/uL (ref 3.77–5.28)
RDW: 12.3 % (ref 12.3–15.4)
WBC: 11.6 10*3/uL — AB (ref 3.4–10.8)

## 2018-02-18 LAB — TSH: TSH: 2.21 u[IU]/mL (ref 0.450–4.500)

## 2018-02-18 LAB — TOPIRAMATE LEVEL: TOPIRAMATE LVL: 6.6 ug/mL (ref 2.0–25.0)

## 2018-02-28 ENCOUNTER — Other Ambulatory Visit: Payer: Self-pay | Admitting: Neurology

## 2018-02-28 MED ORDER — PROPRANOLOL HCL ER 60 MG PO CP24: 60.0000 mg | ORAL_CAPSULE | Freq: Every day | ORAL | 11 refills | Status: DC

## 2018-03-11 ENCOUNTER — Telehealth: Payer: Self-pay

## 2018-03-11 NOTE — Telephone Encounter (Signed)
Effective from 03/11/2018 through 06/08/2018. Approve on cover my medications for aimovig.

## 2018-04-09 ENCOUNTER — Other Ambulatory Visit: Payer: Self-pay | Admitting: Occupational Medicine

## 2018-04-09 ENCOUNTER — Ambulatory Visit: Payer: Self-pay

## 2018-04-09 DIAGNOSIS — M79672 Pain in left foot: Secondary | ICD-10-CM

## 2018-05-29 ENCOUNTER — Telehealth: Payer: Self-pay | Admitting: *Deleted

## 2018-05-29 NOTE — Telephone Encounter (Signed)
Aimovig renewal PA completed on Cover My Meds. Key: [id number]. Anticipate determination within 3 business days.   If [employer] has not responded in 3 business days or if you have any questions about your submission, contact [employer] at [phone].

## 2018-05-30 ENCOUNTER — Encounter: Payer: Self-pay | Admitting: *Deleted

## 2018-05-30 NOTE — Telephone Encounter (Signed)
Received notification that Aimovig 140 mg has been approved from 05/29/18 through 05/28/2019.   Messaged pt through [employer].

## 2019-01-21 ENCOUNTER — Other Ambulatory Visit: Payer: Self-pay | Admitting: Neurology

## 2019-01-23 ENCOUNTER — Encounter: Payer: Self-pay | Admitting: *Deleted

## 2019-01-23 ENCOUNTER — Telehealth: Payer: Self-pay | Admitting: Neurology

## 2019-01-23 ENCOUNTER — Encounter: Payer: Self-pay | Admitting: Neurology

## 2019-01-23 NOTE — Telephone Encounter (Signed)
Noted  

## 2019-01-23 NOTE — Telephone Encounter (Addendum)
I spoke with the patient. She agreed to do a VV. She will try a mychart visit and if there are technical difficulties she will use [email or web address] as a backup. She confirmed DOB as well and provided updates to medications. She is taking Propranolol and Topiramate daily but cannot take her acute medications at work d/t side effects. No changes to PMH. She understands the instructions and when to join the mychart video visit 5-10 minutes before the appt.   Doxy link sent to pt in mychart. Appt changed to mychart visit.

## 2019-01-23 NOTE — Telephone Encounter (Addendum)
I spoke with the patient and advised an appt is needed. Pt hasn't been seen since June 2019. She VU and I scheduled her for Monday 7/6 @ 2:30 pm arrival 2:00 with Amy NP. She stated her medication isn't helping much. The Topamax helps some at night and the Flexeril helps temporarily. Pt denied any fever, cough, SOB, symptoms. She takes her temp multiple times daily while working as she does work for EMS and transports covid patients. She stated she wears the proper PPE including N95 masks. She understands to wear a mask here and that screening questions will be asked upon arrival along with temp check. I also advised in the meantime if the patient has any acute worsening proceed to the ED.   Megan NP aware of above. I will recommend patient do a virtual visit instead.

## 2019-01-23 NOTE — Telephone Encounter (Signed)
Pt is calling in requesting a call back to discuss her migraines, she states she wants to discuss also a med change.

## 2019-01-27 ENCOUNTER — Other Ambulatory Visit: Payer: Self-pay | Admitting: Family Medicine

## 2019-01-27 ENCOUNTER — Telehealth (INDEPENDENT_AMBULATORY_CARE_PROVIDER_SITE_OTHER): Payer: BC Managed Care – PPO | Admitting: Family Medicine

## 2019-01-27 DIAGNOSIS — G43709 Chronic migraine without aura, not intractable, without status migrainosus: Secondary | ICD-10-CM

## 2019-01-27 MED ORDER — UBRELVY 50 MG PO TABS: 50.0000 mg | ORAL_TABLET | Freq: Every day | ORAL | 11 refills | Status: DC | PRN

## 2019-01-27 MED ORDER — METHYLPREDNISOLONE 4 MG PO TBPK: ORAL_TABLET | ORAL | 0 refills | Status: DC

## 2019-01-28 ENCOUNTER — Encounter: Payer: Self-pay | Admitting: Family Medicine

## 2019-01-28 ENCOUNTER — Telehealth: Payer: Self-pay | Admitting: Family Medicine

## 2019-01-28 NOTE — Telephone Encounter (Signed)
Initiated a PA on [place] for ubrelvy 50mg  tablet #10/30days. Has tried maxalt, imitrex. For acute migraine.  Dx A19.379, K24.097.

## 2019-01-28 NOTE — Progress Notes (Signed)
Made any corrections needed, and agree with history, physical, neuro exam,assessment and plan as stated.     Antonia Ahern, MD [facility]  

## 2019-01-28 NOTE — Progress Notes (Signed)
PATIENT: Alice Mcbride DOB: May 06, 1986  REASON FOR VISIT: follow up HISTORY FROM: patient  Virtual Visit via Telephone Note  I connected with Alice Mcbride on 01/28/19 at  2:30 PM EDT by telephone and verified that I am speaking with the correct person using two identifiers.   I discussed the limitations, risks, security and privacy concerns of performing an evaluation and management service by telephone and the availability of in person appointments. I also discussed with the patient that there may be a patient responsible charge related to this service. The patient expressed understanding and agreed to proceed.   History of Present Illness:  01/28/19 Alice MeringStephanie M Mcbride is a 33 y.o. female here today for follow up of migraines. She has done very well on Aimovig 140 mg injections until the last few months.  She is also taking topiramate 150 mg at bedtime.  She reports that migraines were occurring about once per month and manageable until the past 3 to 4 days.  She reports that she has had a migraine that is unresponsive to typical treatment.  She does admit to taking ibuprofen regularly.  She has taken at least 600 mg every day.  Sometimes multiple times a day.  She reports that Maxalt does help her headache but makes her extremely tired.  She cannot take this and work.  She is doing well otherwise.   History (copied from Dr Trevor MaceAhern's note on 12/28/2017)  Interval history 12/28/2017: she still has has 15 headache days. She has 8 migraines and 5 can be severe where she in bed has to take flexeril and sleep. Takes Topiramate 150mg  at bedtime. Helped with severity and frequency of migraines. Acute medication works. But still suffering from significant migraines. Discussed options such as increasing current meds, botox for migraines, trying other preventatives and cgrp. She does not Mcbride to take another med and not interested in botox would like cgrp. Will order but advised that in a year se  may not qualify if we don;t try other medications. She would just like to try cgrp for now. She has accompanied nausea and neck tightness  Tried: topiramate IR, fioricet, flexeril, compazine, maxalt, imitrex, qudexy  HPI:  Alice Mcbride is a 33 y.o. female here as an urgent that an appointment for severe migraine. Last week she had 4-5 migraines otherwise she was doing well. Nothing changed. She had migraines wed, fri and yesterday and she has had a migraine since yesterday which has been the severest in years. She went off of the daily meds and no longer has rebound or medication overuse headache. She is taking Topamax extended release with good results and no side effects discussed teratogenicity do not get pregnant on this medication. She has tried everything since yesterday, Imitrex, Compazine, Flexeril. Imitrex didn;t help. Compazine didn;t help. Flexeril didn;t help. Imitrex did not help. Migraines start slow. She took the imitrex right away and it did not help. She is on 100mg  topamax. Discussed at this point we'll change her acute management so that hopefully migraines don't progress like this. We'll changed to Relpax. Can take with Flexeril.  ZOX:[id number]:Alice Mcbride a 33 y.o.femalehere as a referral from Dr. Virginia CrewsJefferyfor migraines worsening over the last 6 months in frequency and severity. 2 months ago she bent over and felt a sharp pain on the left side of her head and it happened again and it stayed. On the left side of the head. Turned into a constant migraine She had a tramadol  shot and steroid shot at the end which helped for 6 hours and then came back. [name] started flexeril at night to try and help her sleep, she was worn out due to the pain.The Fioricet helps sometimes She is taking Fioricet and has had the bottle refilled.   Her migraines include light sensitivity, nausea and vomiting, unilateral on either side or behind the eyes is very  common, throbbing and pounding, smells don;t bother her but she is sensitive to light and sound. Unknown triggers. No food triggers. Most of the time she sleep well and get a goods night sleep. The flexeril helping her sleep. The headaches are waking her up for the last 2 months. She is having blurry vision. No snoring. She has daily headaches. Continuous headaches. At least 1/2 are migrainous. Headaches are worse laying down. Very tired. For the last 6 months its worsening and she is having headaches at least 12-15 a month.   Reviewed notes, labs and imaging from outside physicians, which showed:   Reviewed labs CMP and CBC normal.  Reviewed primary care notes. 3 weeks ago after getting out of the shower she leaned over to dry her hair and when she stood up she felt a sharp pain in the left side of the head behind the ear that lasted about 30 minutes. Recurred a few days later with similar movement same pain and location. Has had pain constantly since then. Most of the pain is not the back of her head and at the top of the neck. Rates the pain as 5 out of 10 but can be 8 out of 10 at its worst. Ibuprofen helps a little, reducing the pain to 3 out of 10. Quality is throbbing ache that occasional sharp pain. Several days previous to last primary care appointment she presented to another facility, received Augmentin, steroid injection and Toradol injection for presumed sinusitis even though she didn't feel like she had any symptoms. She had minimal improvement. Rapid position changes cause some dizziness. She was treated with a muscle relaxant and Fioricet.  Observations/Objective:  Generalized: Well developed, in no acute distress  Mentation: Alert oriented to time, place, history taking. Follows all commands speech and language fluent   Assessment and Plan:  33 y.o. year old female  has a past medical history of Migraine. here with    ICD-10-CM   1. Chronic migraine w/o aura w/o status  migrainosus, not intractable  G43.709 methylPREDNISolone (MEDROL DOSEPAK) 4 MG TBPK tablet    Ubrogepant (UBRELVY) 50 MG TABS   Unfortunately migraines have worsened over the past few months with acute worsening over the past couple of days.  We will start prednisone taper pack as prescribed.  We will also trial Ubrelvy for abortive therapy pending insurance approval.  I have advised avoidance of over-the-counter medications on a regular basis.  She was advised that should headaches continue to persist we may decide to change her preventative medications.  She was encouraged to eat a well-balanced diet and stay well-hydrated.  I have asked that she reach out in about 3 to 4 weeks with a progress report.  We will determine follow-up at that point.  If doing well she can follow-up annually.  She verbalizes understanding and agreement with this plan.  No orders of the defined types were placed in this encounter.   Meds ordered this encounter  Medications   methylPREDNISolone (MEDROL DOSEPAK) 4 MG TBPK tablet    Sig:  Taper pack as directed for migraine    Dispense:  21 each    Refill:  0    Order Specific Question:   Supervising Provider    Answer:   Melvenia Beam [[id number]]   Ubrogepant (UBRELVY) 50 MG TABS    Sig: Take 50 mg by mouth daily as needed. Take one tablet at onset of headache, may repeat 1 tablet in 2 hours, no more than 2 tablets in 24 hours    Dispense:  10 tablet    Refill:  11    Order Specific Question:   Supervising Provider    Answer:   Melvenia Beam [[id number]]     Follow Up Instructions:  I discussed the assessment and treatment plan with the patient. The patient was provided an opportunity to ask questions and all were answered. The patient agreed with the plan and demonstrated an understanding of the instructions.   The patient was advised to call back or seek an in-person evaluation if the symptoms worsen or if the condition fails to improve as anticipated.  I  provided 15 minutes of non-face-to-face time during this encounter.  Patient is located at her place of residence during video conference.  Provider is located in the office.  Maryelizabeth Kaufmann, CMA helped to facilitate visit.   Debbora Presto, NP

## 2019-01-28 NOTE — Telephone Encounter (Signed)
Received PA approval REF # [id number] good from 7-77-20 thru 04-21-19.  Ubrelvy 50mg  tabsl.  Fax confirmation received  [phone].  I relayed this to pt.  She verbalized understanding.

## 2019-01-28 NOTE — Telephone Encounter (Signed)
Pt called and states that her insurance does not cover Ubrogepant (UBRELVY) 50 MG TABS. She states the pharmacy reached out to the office via fax yesterday about the medication. Pt would like to be advised on what's the next step she can take.

## 2019-01-29 NOTE — Telephone Encounter (Signed)
Tried to call pt about ubrelvy savings card. Was not able to LM.

## 2019-01-29 NOTE — Telephone Encounter (Signed)
Pt would like to speak to the provider about other options of medications. Pt states the Alice Mcbride is still to expensive even with insurance approval. Please advise.

## 2019-02-04 NOTE — Telephone Encounter (Signed)
I spoke to pt.  She will get on line and activate ubrelvy card and see if this will make difference in cost for her (now $200.00 per insurance).

## 2019-04-17 ENCOUNTER — Telehealth: Payer: Self-pay | Admitting: *Deleted

## 2019-04-17 NOTE — Telephone Encounter (Signed)
Received fax from [facility].  I called pharmacy. Pt used savings coupon for ubrely and picked up on 01/2019.  Has not picked up since.  Her cost with coupon with $10.00.  Will hold off doing anything else at this time.

## 2019-05-14 ENCOUNTER — Other Ambulatory Visit: Payer: Self-pay | Admitting: Neurology

## 2019-05-14 ENCOUNTER — Encounter: Payer: Self-pay | Admitting: *Deleted

## 2019-05-14 NOTE — Telephone Encounter (Signed)
Message sent to pt to clarify if she is still taking this.

## 2019-05-15 ENCOUNTER — Other Ambulatory Visit: Payer: Self-pay | Admitting: Family Medicine

## 2019-05-15 MED ORDER — NURTEC 75 MG PO TBDP: 75.0000 mg | ORAL_TABLET | Freq: Every day | ORAL | 11 refills | Status: DC | PRN

## 2019-05-20 ENCOUNTER — Telehealth: Payer: Self-pay

## 2019-05-20 NOTE — Telephone Encounter (Signed)
PA for Aimovig  Your information has been submitted to [employer] [place]. [employer] [place] will review the request and fax you a determination directly, typically within 3 business days of your submission once all necessary information is received. If [employer] [place] has not responded in 3 business days or if you have any questions about your submission, contact Blue Cross  at [phone].  

## 2019-05-22 ENCOUNTER — Telehealth: Payer: Self-pay

## 2019-05-22 NOTE — Telephone Encounter (Signed)
I called the pt about her aimvoig being cancel. PT stated she is no longer taking the medication. She last took it in July 2020. She does have BCBS but does not have the card on her. She does have [place].

## 2019-05-22 NOTE — Telephone Encounter (Signed)
I call pt about her insurance cards on file showing no coverage. I ask pt if she can bring a copy to the office. PT stated she tested positive for the COVID 19 and is quarantine now. I advise pt once she is symptom and has quarantine for 14 days to give [place] a copy of her insurance card. Pt verbalized understanding.

## 2019-05-22 NOTE — Telephone Encounter (Signed)
This request has received a Cancelled outcome.  This may mean either your patient does not have active coverage with this plan, this authorization was processed as a duplicate request, or an authorization was not needed for this medication.  Note any additional information provided by [employer] [place] at the bottom of this request, and contact [employer] [place] directly for further details. 

## 2019-11-27 ENCOUNTER — Telehealth: Payer: Self-pay | Admitting: Family Medicine

## 2019-11-27 DIAGNOSIS — G43709 Chronic migraine without aura, not intractable, without status migrainosus: Secondary | ICD-10-CM

## 2019-11-27 MED ORDER — NURTEC 75 MG PO TBDP: 75.0000 mg | ORAL_TABLET | Freq: Every day | ORAL | 11 refills | Status: DC | PRN

## 2019-11-27 MED ORDER — UBRELVY 50 MG PO TABS: 50.0000 mg | ORAL_TABLET | Freq: Every day | ORAL | 2 refills | Status: DC | PRN

## 2019-11-27 NOTE — Addendum Note (Signed)
Addended by: Guy Begin on: 11/27/2019 05:31 PM   Modules accepted: Orders

## 2019-11-27 NOTE — Telephone Encounter (Signed)
Pt called stating that she has taken the last of her Rimegepant Sulfate (NURTEC) 75 MG TBDP today and she is needing a refill but she was needing to speak to RN about changing this medication due to her change in insurance. She was informed that her new insurance does not cover Rimegepant Sulfate (NURTEC) 75 MG TBDP. Please advise.

## 2019-11-27 NOTE — Telephone Encounter (Signed)
I called pt and she would like to see if Alice Mcbride will be covered for her since the new insurance is not covering her nurtec or is too expensive.  UHC ID [id number] RXBIN [id number], RXPCN [id number] RXGRP UHC.

## 2019-11-27 NOTE — Telephone Encounter (Signed)
She can try the coupon card if commercial coverage. Otherwise, I would need to know what is covered. Does she have an idea of what they will cover? Alice Mcbride?

## 2019-12-02 ENCOUNTER — Encounter: Payer: Self-pay | Admitting: Family Medicine

## 2020-01-02 ENCOUNTER — Other Ambulatory Visit: Payer: Self-pay | Admitting: Neurology

## 2020-02-02 ENCOUNTER — Ambulatory Visit: Payer: Self-pay

## 2020-02-02 ENCOUNTER — Other Ambulatory Visit: Payer: Self-pay | Admitting: Family Medicine

## 2020-02-02 ENCOUNTER — Other Ambulatory Visit: Payer: Self-pay

## 2020-02-02 DIAGNOSIS — M545 Low back pain, unspecified: Secondary | ICD-10-CM

## 2021-01-01 ENCOUNTER — Other Ambulatory Visit: Payer: Self-pay | Admitting: Neurology

## 2021-01-29 ENCOUNTER — Other Ambulatory Visit: Payer: Self-pay | Admitting: Neurology

## 2021-02-03 ENCOUNTER — Other Ambulatory Visit: Payer: Self-pay

## 2021-02-04 MED ORDER — TOPIRAMATE 50 MG PO TABS: 150.0000 mg | ORAL_TABLET | Freq: Every day | ORAL | 0 refills | Status: DC

## 2021-05-11 ENCOUNTER — Other Ambulatory Visit: Payer: Self-pay

## 2021-05-11 MED ORDER — TOPIRAMATE 50 MG PO TABS: 150.0000 mg | ORAL_TABLET | Freq: Every day | ORAL | 0 refills | Status: DC

## 2021-07-29 ENCOUNTER — Other Ambulatory Visit: Payer: Self-pay | Admitting: Neurology

## 2021-08-10 ENCOUNTER — Other Ambulatory Visit: Payer: Self-pay | Admitting: Neurology

## 2021-08-10 NOTE — Telephone Encounter (Signed)
I believe this is your patient, Sheralyn Boatman and Amy.

## 2021-08-11 ENCOUNTER — Other Ambulatory Visit: Payer: Self-pay

## 2021-08-11 ENCOUNTER — Ambulatory Visit (INDEPENDENT_AMBULATORY_CARE_PROVIDER_SITE_OTHER): Payer: No Typology Code available for payment source | Admitting: Nurse Practitioner

## 2021-08-11 ENCOUNTER — Encounter: Payer: Self-pay | Admitting: Nurse Practitioner

## 2021-08-11 VITALS — BP 126/90 | HR 74 | Temp 97.9°F | Ht 69.0 in | Wt 193.4 lb

## 2021-08-11 DIAGNOSIS — G43109 Migraine with aura, not intractable, without status migrainosus: Secondary | ICD-10-CM | POA: Diagnosis not present

## 2021-08-11 DIAGNOSIS — Z136 Encounter for screening for cardiovascular disorders: Secondary | ICD-10-CM | POA: Diagnosis not present

## 2021-08-11 DIAGNOSIS — M5442 Lumbago with sciatica, left side: Secondary | ICD-10-CM | POA: Diagnosis not present

## 2021-08-11 DIAGNOSIS — F32A Depression, unspecified: Secondary | ICD-10-CM | POA: Diagnosis not present

## 2021-08-11 DIAGNOSIS — Z1159 Encounter for screening for other viral diseases: Secondary | ICD-10-CM | POA: Diagnosis not present

## 2021-08-11 DIAGNOSIS — Z13220 Encounter for screening for lipoid disorders: Secondary | ICD-10-CM

## 2021-08-11 DIAGNOSIS — G8929 Other chronic pain: Secondary | ICD-10-CM

## 2021-08-11 DIAGNOSIS — Z7689 Persons encountering health services in other specified circumstances: Secondary | ICD-10-CM

## 2021-08-11 DIAGNOSIS — E663 Overweight: Secondary | ICD-10-CM

## 2021-08-11 DIAGNOSIS — Z114 Encounter for screening for human immunodeficiency virus [HIV]: Secondary | ICD-10-CM

## 2021-08-11 DIAGNOSIS — F419 Anxiety disorder, unspecified: Secondary | ICD-10-CM | POA: Diagnosis not present

## 2021-08-11 LAB — COMPREHENSIVE METABOLIC PANEL
ALT: 17 U/L (ref 0–35)
AST: 14 U/L (ref 0–37)
Albumin: 4.7 g/dL (ref 3.5–5.2)
Alkaline Phosphatase: 66 U/L (ref 39–117)
BUN: 9 mg/dL (ref 6–23)
CO2: 29 mEq/L (ref 19–32)
Calcium: 9.8 mg/dL (ref 8.4–10.5)
Chloride: 103 mEq/L (ref 96–112)
Creatinine, Ser: 0.92 mg/dL (ref 0.40–1.20)
GFR: 81.09 mL/min (ref >60.00)
Glucose, Bld: 90 mg/dL (ref 70–99)
Potassium: 4.2 mEq/L (ref 3.5–5.1)
Sodium: 140 mEq/L (ref 135–145)
Total Bilirubin: 0.3 mg/dL (ref 0.2–1.2)
Total Protein: 7.3 g/dL (ref 6.0–8.3)

## 2021-08-11 LAB — CBC WITH DIFFERENTIAL/PLATELET
Basophils Absolute: 0.1 10*3/uL (ref 0.0–0.1)
Basophils Relative: 0.7 % (ref 0.0–3.0)
Eosinophils Absolute: 0.1 10*3/uL (ref 0.0–0.7)
Eosinophils Relative: 1.4 % (ref 0.0–5.0)
HCT: 41.1 % (ref 36.0–46.0)
Hemoglobin: 13.8 g/dL (ref 12.0–15.0)
Lymphocytes Relative: 19.9 % (ref 12.0–46.0)
Lymphs Abs: 1.7 10*3/uL (ref 0.7–4.0)
MCHC: 33.5 g/dL (ref 30.0–36.0)
MCV: 89.4 fl (ref 78.0–100.0)
Monocytes Absolute: 0.6 10*3/uL (ref 0.1–1.0)
Monocytes Relative: 7.6 % (ref 3.0–12.0)
Neutro Abs: 5.9 10*3/uL (ref 1.4–7.7)
Neutrophils Relative %: 70.4 % (ref 43.0–77.0)
Platelets: 300 10*3/uL (ref 150.0–400.0)
RBC: 4.6 Mil/uL (ref 3.87–5.11)
RDW: 13 % (ref 11.5–15.5)
WBC: 8.4 10*3/uL (ref 4.0–10.5)

## 2021-08-11 LAB — LIPID PANEL
Cholesterol: 205 mg/dL — ABNORMAL HIGH (ref 0–200)
HDL: 62.3 mg/dL
LDL Cholesterol: 125 mg/dL — ABNORMAL HIGH (ref 0–99)
NonHDL: 142.98
Total CHOL/HDL Ratio: 3
Triglycerides: 92 mg/dL (ref 0.0–149.0)
VLDL: 18.4 mg/dL (ref 0.0–40.0)

## 2021-08-11 LAB — TSH: TSH: 1.51 u[IU]/mL (ref 0.35–5.50)

## 2021-08-11 MED ORDER — TOPIRAMATE 50 MG PO TABS: 150.0000 mg | ORAL_TABLET | Freq: Every day | ORAL | 0 refills | Status: DC

## 2021-08-11 MED ORDER — DULOXETINE HCL 30 MG PO CPEP: 30.0000 mg | ORAL_CAPSULE | Freq: Every day | ORAL | 1 refills | Status: DC

## 2021-08-11 NOTE — Progress Notes (Signed)
New Patient Office Visit  Subjective:  Patient ID: Alice Mcbride, female    DOB: 05-Aug-1985  Age: 36 y.o. MRN: [id number]  CC:  Chief Complaint  Patient presents with   Establish Care    NP. Est care. Not fasting. Chronic migraines, pt sees neurologist.     HPI Alice MeringStephanie M Mcbride presents for new patient visit to establish care.  Introduced to Publishing rights managernurse practitioner role and practice setting.  All questions answered.  Discussed provider/patient relationship and expectations.  BACK PAIN  Duration: months Mechanism of injury:  injury while lifting stretcher Location: Left and low back Onset: sudden Severity: 4/10 Quality: sharp Frequency: constant Radiation: L leg above the knee Aggravating factors: walking, laying, and prolonged sitting Alleviating factors: laying flat, stretches Status: fluctuating Treatments attempted: heat, ice, tens machine, massage gun, stretches, ibuprofen  Relief with NSAIDs?: mild Nighttime pain:  yes Paresthesias / decreased sensation:  yes when sitting for long periods of time Bowel / bladder incontinence:  no Fevers:  no Dysuria / urinary frequency:  yes   Dep[facility] 08/11/2021 12/11/2016 10/13/2016  Decreased Interest 1 0 0  Down, Depressed, Hopeless 1 0 0  PHQ - 2 Score 2 0 0  Altered sleeping 3 - -  Tired, decreased energy 1 - -  Change in appetite 2 - -  Feeling bad or failure about yourself  0 - -  Trouble concentrating 0 - -  Moving slowly or fidgety/restless 0 - -  Suicidal thoughts 0 - -  PHQ-9 Score 8 - -  Difficult doing work/chores Somewhat difficult - -   GAD 7 : Generalized Anxiety Score 08/11/2021  Nervous, Anxious, on Edge 1  Control/stop worrying 2  Worry too much - different things 2  Trouble relaxing 1  Restless 0  Easily annoyed or irritable 0  Afraid - awful might happen 0  Total GAD 7 Score 6  Anxiety Difficulty Somewhat difficult    Past Medical History:  Diagnosis Date   Chronic low back  pain    Migraine     Past Surgical History:  Procedure Laterality Date   WISDOM TOOTH EXTRACTION      Family History  Problem Relation Age of Onset   Hypertension Mother    Migraines Neg Hx     Social History   Socioeconomic History   Marital status: Significant Other    Spouse name: Deanna   Number of children: 0   Years of education: EMT certification   Highest education level: Not on file  Occupational History   Occupation: EMT    Comment: [employer]  Tobacco Use   Smoking status: Former    Types: Cigarettes    Quit date: 2011    Years since quitting: 12.0   Smokeless tobacco: Never  Vaping Use   Vaping Use: Every day  Substance and Sexual Activity   Alcohol use: Yes    Alcohol/week: 2.0 standard drinks    Types: 2 Cans of beer per week    Comment: occasionally   Drug use: No   Sexual activity: Yes    Partners: Female    Birth control/protection: None  Other Topics Concern   Not on file  Social History Narrative   Lives with her partner and her partner's two children.   [place].   Right-handed   Caffeine: none    Social Determinants of Corporate investment bankerHealth   Financial Resource Strain: Not on file  Food Insecurity: Not on file  Transportation Needs: Not on file  Physical Activity: Not on file  Stress: Not on file  Social Connections: Not on file  Intimate Partner Violence: Not on file    ROS Review of Systems  Constitutional:  Positive for fatigue.  HENT: Negative.    Eyes: Negative.   Respiratory: Negative.    Cardiovascular: Negative.   Gastrointestinal: Negative.   Endocrine: Negative.   Genitourinary:  Positive for frequency. Negative for dysuria.  Musculoskeletal:  Positive for back pain.  Skin: Negative.   Neurological:  Positive for headaches (history of migraines).  Psychiatric/Behavioral:  The patient is nervous/anxious.    Objective:   Today's Vitals: BP 126/90    Pulse 74    Temp 97.9 F (36.6 C)  (Temporal)    Ht 5\' 9"  (1.753 m)    Wt 193 lb 6.4 oz (87.7 kg)    LMP 07/25/2021 (Exact Date)    SpO2 96%    BMI 28.56 kg/m   Physical Exam Vitals and nursing note reviewed.  Constitutional:      General: She is not in acute distress.    Appearance: Normal appearance.  HENT:     Head: Normocephalic and atraumatic.     Right Ear: Tympanic membrane, ear canal and external ear normal.     Left Ear: Tympanic membrane, ear canal and external ear normal.     Nose: Nose normal.     Mouth/Throat:     Mouth: Mucous membranes are moist.     Pharynx: Oropharynx is clear.  Eyes:     Conjunctiva/sclera: Conjunctivae normal.  Cardiovascular:     Rate and Rhythm: Normal rate and regular rhythm.     Pulses: Normal pulses.     Heart sounds: Normal heart sounds.  Pulmonary:     Effort: Pulmonary effort is normal.     Breath sounds: Normal breath sounds.  Abdominal:     General: Bowel sounds are normal.     Palpations: Abdomen is soft.     Tenderness: There is no abdominal tenderness.  Musculoskeletal:        General: Tenderness (Left lumbar spine) present. Normal range of motion.     Cervical back: Normal range of motion and neck supple. No tenderness.     Comments: Strength 5/5 in bilateral upper and lower extremities. Negative straight leg raise bilaterally.   Lymphadenopathy:     Cervical: No cervical adenopathy.  Skin:    General: Skin is warm and dry.  Neurological:     General: No focal deficit present.     Mental Status: She is alert and oriented to person, place, and time.     Cranial Nerves: No cranial nerve deficit.     Coordination: Coordination normal.     Gait: Gait normal.  Psychiatric:        Mood and Affect: Mood normal.        Behavior: Behavior normal.        Thought Content: Thought content normal.        Judgment: Judgment normal.    Assessment & Plan:   Problem List Items Addressed This Visit       Cardiovascular and Mediastinum   Chronic migraine with aura     Chronic, ongoing. Follows with 09/22/2021, NP with neurology. Has tried fioricet, flexeril, maxalt, imitrex, ubrelvy, aimovig, and propanolol. Current regimen is topamax 150mg  daily and flexeril and ibuprofen as needed. Currently well controlled and gets around 2 headaches per month. Continue current regimen along with collaboration  and recommendations from neurology.       Relevant Medications   DULoxetine (CYMBALTA) 30 MG capsule   Other Relevant Orders   CBC with Differential/Platelet   Comprehensive metabolic panel     Nervous and Auditory   Chronic left-sided low back pain with left-sided sciatica - Primary    Chronic, ongoing. Has had ongoing back pain since an injury at work about a year ago. She has been seeing a provider with workman's comp and said that nothing has helped with her pain. She states that x-rays showed bulging discs in her back. She has been to chiropractor, takes ibuprofen, use heat/ice, and does daily stretches. Will start her on cymbalta 30mg  daily to see if this helps her back pain along with the anxiety/depression as noted below. Continue heat/ice, and daily stretches along with ibuprofen as needed. Follow up in 4-6 weeks.       Relevant Medications   DULoxetine (CYMBALTA) 30 MG capsule     Other   Anxiety and depression    Ongoing since time of back injury one year ago. 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 She had to stop playing softball. Will start her on cymbalta 30mg  daily to help with mood and pain. Currently denies SI/HI. If this develops or changes, call office immediately or go to ER. Follow up in 4-6 weeks.       Relevant Medications   DULoxetine (CYMBALTA) 30 MG capsule   Other Relevant Orders   TSH   Other Visit Diagnoses     Encounter for hepatitis C screening test for low risk patient       Check hepatitis C today   Relevant Orders   Hepatitis C antibody   Screening for HIV (human immunodeficiency  virus)       Screen for HIV today   Relevant Orders   HIV Antibody (routine testing w rflx)   Encounter for lipid screening for cardiovascular disease       Screen for lipid today   Relevant Orders   Lipid panel   Overweight       BMI today 28.5. She has gained weight over the last year with her back injury. Discussed healthy eating and gentle exercise as able.    Encounter to establish care           Outpatient Encounter Medications as of 08/11/2021  Medication Sig   cyclobenzaprine (FLEXERIL) 10 MG tablet Take 1 tablet (10 mg total) by mouth 3 (three) times daily as needed for muscle spasms.   DULoxetine (CYMBALTA) 30 MG capsule Take 1 capsule (30 mg total) by mouth daily.   topiramate (TOPAMAX) 50 MG tablet Take 3 tablets (150 mg total) by mouth at bedtime. MUST BE SEEN FOR FURTHER REFILLS. CALL [phone].   IBUPROFEN PO Take by mouth as needed.   [DISCONTINUED] Erenumab-aooe (AIMOVIG) 140 MG/ML SOAJ Inject 140 mg into the skin at bedtime. (Patient not taking: Reported on 05/22/2019)   [DISCONTINUED] methylPREDNISolone (MEDROL DOSEPAK) 4 MG TBPK tablet Taper pack as directed for migraine   [DISCONTINUED] prochlorperazine (COMPAZINE) 10 MG tablet Take 1 tablet (10 mg total) by mouth every 6-8  hours as needed for nausea or vomiting or migraine/headache   [DISCONTINUED] propranolol ER (INDERAL LA) 60 MG 24 hr capsule TAKE ONE CAPSULE BY MOUTH AT BEDTIME   [DISCONTINUED] Rimegepant Sulfate (NURTEC) 75 MG TBDP Take 75 mg by mouth daily as needed (take for abortive  therapy of migraine, no more than 1 tablet in 24 hours or 10 per month).   [DISCONTINUED] rizatriptan (MAXALT) 10 MG tablet Take 1 tablet (10 mg total) by mouth as needed for migraine. May repeat in 2 hours if needed. Max 2 doses in one day.   [DISCONTINUED] Ubrogepant (UBRELVY) 50 MG TABS Take 50 mg by mouth daily as needed. Take one tablet at onset of headache, may repeat 1 tablet in 2 hours, no more than 2 tablets in 24 hours    [DISCONTINUED] valACYclovir (VALTREX) 1000 MG tablet Take 2000 mg with onset of fever blister. Repeat in 12 hours. (Patient not taking: Reported on 01/23/2019)   No facility-administered encounter medications on file as of 08/11/2021.    Follow-up: Return in about 4 weeks (around 09/08/2021) for 4-6 weeks for Anxiety, Depression, back pain.   Gerre ScullLauren A Shay Bartoli, NP

## 2021-08-11 NOTE — Assessment & Plan Note (Signed)
Chronic, ongoing. Follows with Shawnie Dapper, NP with neurology. Has tried fioricet, flexeril, maxalt, imitrex, ubrelvy, aimovig, and propanolol. Current regimen is topamax 150mg  daily and flexeril and ibuprofen as needed. Currently well controlled and gets around 2 headaches per month. Continue current regimen along with collaboration and recommendations from neurology.

## 2021-08-11 NOTE — Patient Instructions (Signed)
It was great to see you!  Start cymbalta once a day (can take any time of day) to help with back pain and anxiety/depression.   Continue taking ibuprofen as needed and doing daily stretches.   Let's follow-up in 4-6 weeks, sooner if you have concerns.  If a referral was placed today, you will be contacted for an appointment. Please note that routine referrals can sometimes take up to 3-4 weeks to process. Please call our office if you haven't heard anything after this time frame.  Take care,  Rodman Pickle, NP

## 2021-08-11 NOTE — Assessment & Plan Note (Signed)
Ongoing since time of back injury one year ago. 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 She had to stop playing softball. Will start her on cymbalta 30mg  daily to help with mood and pain. Currently denies SI/HI. If this develops or changes, call office immediately or go to ER. Follow up in 4-6 weeks.

## 2021-08-11 NOTE — Assessment & Plan Note (Signed)
Chronic, ongoing. Has had ongoing back pain since an injury at work about a year ago. She has been seeing a provider with workman's comp and said that nothing has helped with her pain. She states that x-rays showed bulging discs in her back. She has been to chiropractor, takes ibuprofen, use heat/ice, and does daily stretches. Will start her on cymbalta 30mg  daily to see if this helps her back pain along with the anxiety/depression as noted below. Continue heat/ice, and daily stretches along with ibuprofen as needed. Follow up in 4-6 weeks.

## 2021-08-12 LAB — HIV ANTIBODY (ROUTINE TESTING W REFLEX): HIV 1&2 Ab, 4th Generation: NONREACTIVE

## 2021-08-12 LAB — HEPATITIS C ANTIBODY
Hepatitis C Ab: NONREACTIVE
SIGNAL TO CUT-OFF: 0.1 (ref <1.00)

## 2021-09-08 ENCOUNTER — Other Ambulatory Visit: Payer: Self-pay

## 2021-09-08 ENCOUNTER — Encounter: Payer: Self-pay | Admitting: Nurse Practitioner

## 2021-09-08 ENCOUNTER — Ambulatory Visit (INDEPENDENT_AMBULATORY_CARE_PROVIDER_SITE_OTHER): Payer: No Typology Code available for payment source | Admitting: Nurse Practitioner

## 2021-09-08 VITALS — BP 144/98 | HR 99 | Temp 96.9°F | Wt 191.4 lb

## 2021-09-08 DIAGNOSIS — Z23 Encounter for immunization: Secondary | ICD-10-CM | POA: Diagnosis not present

## 2021-09-08 DIAGNOSIS — F32A Depression, unspecified: Secondary | ICD-10-CM

## 2021-09-08 DIAGNOSIS — G43109 Migraine with aura, not intractable, without status migrainosus: Secondary | ICD-10-CM

## 2021-09-08 DIAGNOSIS — G8929 Other chronic pain: Secondary | ICD-10-CM

## 2021-09-08 DIAGNOSIS — B009 Herpesviral infection, unspecified: Secondary | ICD-10-CM

## 2021-09-08 DIAGNOSIS — F419 Anxiety disorder, unspecified: Secondary | ICD-10-CM

## 2021-09-08 DIAGNOSIS — M5442 Lumbago with sciatica, left side: Secondary | ICD-10-CM

## 2021-09-08 MED ORDER — DULOXETINE HCL 60 MG PO CPEP: 60.0000 mg | ORAL_CAPSULE | Freq: Every day | ORAL | 0 refills | Status: DC

## 2021-09-08 MED ORDER — VALACYCLOVIR HCL 1 G PO TABS: 2000.0000 mg | ORAL_TABLET | Freq: Two times a day (BID) | ORAL | 0 refills | Status: DC | PRN

## 2021-09-08 MED ORDER — TOPIRAMATE 50 MG PO TABS
150.0000 mg | ORAL_TABLET | Freq: Every day | ORAL | 0 refills | Status: DC
Start: 2021-09-08 — End: 2021-11-24

## 2021-09-08 NOTE — Assessment & Plan Note (Signed)
Chronic, exacerbated.  We will send in a prescription for Valtrex to 1000 mg twice a day for 1 day.  Will give extra tablets for future flares.

## 2021-09-08 NOTE — Assessment & Plan Note (Signed)
Chronic, stable.  She follows with Shawnie Dapper with neurology.  She only has 2 tablets left of her Topamax and does not have an appointment with neurology until May.  We will send her in a 90-day of Topamax 150 mg daily.  Follow-up with neurology in May.

## 2021-09-08 NOTE — Patient Instructions (Signed)
It was great to see you!  Let's increase your cymbalta to 60mg  daily. You can take 2 of your current capsules daily until gone, then go back down to 1 capsule daily when you pick up your new bottle.  Let's follow-up in 4-6 weeks, sooner if you have concerns.  If a referral was placed today, you will be contacted for an appointment. Please note that routine referrals can sometimes take up to 3-4 weeks to process. Please call our office if you haven't heard anything after this time frame.  Take care,  , NP

## 2021-09-08 NOTE — Progress Notes (Signed)
Established Patient Office Visit  Subjective:  Patient ID: Alice Mcbride, female    DOB: [birth year]  Age: 36 y.o. MRN: [id number]  CC:  Chief Complaint  Patient presents with   Follow-up    4-6 wk f/u depression/anxiety    HPI Alice Mcbride presents for follow up on back pain, depression and anxiety. She also has had a recent outbreak of her cold sores.  She has a history of cold sores intermittently.  She takes Valtrex which treats them.  She is asking for a refill of Valtrex today.  BACK PAIN  Last visit she was started on Cymbalta 30 mg daily.  She has noticed a significant improvement in her back pain and only endorses intermittent twinges once in a while.  She has also been increasing her exercise and doing stretches.  She has not had any side effects from the medication.  ANXIETY AND DEPRESSION  Alice Mcbride notes some improvement in her anxiety and depression, however it could be better.  She endorses that her anxiety is a little bit worse than her depression.  She denies SI/HI and any side effects from the medication.  Depression screen [facility] 2/9 09/08/2021 08/11/2021 12/11/2016 10/13/2016  Decreased Interest 1 1 0 0  Down, Depressed, Hopeless 1 1 0 0  PHQ - 2 Score 2 2 0 0  Altered sleeping 2 3 - -  Tired, decreased energy 2 1 - -  Change in appetite 0 2 - -  Feeling bad or failure about yourself  1 0 - -  Trouble concentrating 0 0 - -  Moving slowly or fidgety/restless 0 0 - -  Suicidal thoughts 0 0 - -  PHQ-9 Score 7 8 - -  Difficult doing work/chores Somewhat difficult Somewhat difficult - -   GAD 7 : Generalized Anxiety Score 09/08/2021 08/11/2021  Nervous, Anxious, on Edge 2 1  Control/stop worrying 2 2  Worry too much - different things 2 2  Trouble relaxing 1 1  Restless 1 0  Easily annoyed or irritable 2 0  Afraid - awful might happen 1 0  Total GAD 7 Score 11 6  Anxiety Difficulty Somewhat difficult Somewhat difficult    Past Medical History:   Diagnosis Date   Chronic low back pain    Migraine     Past Surgical History:  Procedure Laterality Date   WISDOM TOOTH EXTRACTION      Family History  Problem Relation Age of Onset   Hypertension Mother    Migraines Neg Hx     Social History   Socioeconomic History   Marital status: Significant Other    Spouse name: Deanna   Number of children: 0   Years of education: EMT certification   Highest education level: Not on file  Occupational History   Occupation: EMT    Comment: [employer]  Tobacco Use   Smoking status: Former    Types: Cigarettes    Quit date: 2011    Years since quitting: 12.1   Smokeless tobacco: Never  Vaping Use   Vaping Use: Every day  Substance and Sexual Activity   Alcohol use: Yes    Alcohol/week: 2.0 standard drinks    Types: 2 Cans of beer per week    Comment: occasionally   Drug use: No   Sexual activity: Yes    Partners: Female    Birth control/protection: None  Other Topics Concern   Not on file  Social History Narrative   Lives  with her partner and her partner's two children.   Parents live in [place].   Right-handed   Caffeine: none    Social Determinants of Corporate investment bankerHealth   Financial Resource Strain: Not on file  Food Insecurity: Not on file  Transportation Needs: Not on file  Physical Activity: Not on file  Stress: Not on file  Social Connections: Not on file  Intimate Partner Violence: Not on file    Outpatient Medications Prior to Visit  Medication Sig Dispense Refill   cyclobenzaprine (FLEXERIL) 10 MG tablet Take 1 tablet (10 mg total) by mouth 3 (three) times daily as needed for muscle spasms. 30 tablet 11   IBUPROFEN PO Take by mouth as needed.     DULoxetine (CYMBALTA) 30 MG capsule Take 1 capsule (30 mg total) by mouth daily. 30 capsule 1   topiramate (TOPAMAX) 50 MG tablet Take 3 tablets (150 mg total) by mouth at bedtime. MUST BE SEEN FOR FURTHER REFILLS. CALL [phone]. 90 tablet 0   No  facility-administered medications prior to visit.    Allergies  Allergen Reactions   Bee Venom     ROS Review of Systems See pertinent positives and negatives per HPI.   Objective:    Physical Exam Vitals and nursing note reviewed.  Constitutional:      General: She is not in acute distress.    Appearance: Normal appearance.  HENT:     Head: Normocephalic and atraumatic.  Eyes:     Conjunctiva/sclera: Conjunctivae normal.  Cardiovascular:     Rate and Rhythm: Normal rate.  Pulmonary:     Effort: Pulmonary effort is normal.  Musculoskeletal:     Cervical back: Normal range of motion.  Skin:    General: Skin is warm and dry.     Comments: Blister on lower lip  Neurological:     General: No focal deficit present.     Mental Status: She is alert and oriented to person, place, and time.  Psychiatric:        Mood and Affect: Mood normal.        Behavior: Behavior normal.        Thought Content: Thought content normal.        Judgment: Judgment normal.    BP (!) 144/98 (BP Location: Left Arm, Patient Position: Sitting, Cuff Size: Normal)    Pulse 99    Temp (!) 96.9 F (36.1 C) (Temporal)    Wt 191 lb 6.4 oz (86.8 kg)    SpO2 99%    BMI 28.26 kg/m  Wt Readings from Last 3 Encounters:  09/08/21 191 lb 6.4 oz (86.8 kg)  08/11/21 193 lb 6.4 oz (87.7 kg)  12/28/17 181 lb (82.1 kg)     Health Maintenance Due  Topic Date Due   PAP SMEAR-Modifier  Never done   COVID-19 Vaccine (3 - Booster for Moderna series) 12/13/2020   INFLUENZA VACCINE  02/21/2021    There are no preventive care reminders to display for this patient.  Lab Results  Component Value Date   TSH 1.51 08/11/2021   Lab Results  Component Value Date   WBC 8.4 08/11/2021   HGB 13.8 08/11/2021   HCT 41.1 08/11/2021   MCV 89.4 08/11/2021   PLT 300.0 08/11/2021   Lab Results  Component Value Date   NA 140 08/11/2021   K 4.2 08/11/2021   CO2 29 08/11/2021   GLUCOSE 90 08/11/2021   BUN 9  08/11/2021   CREATININE 0.92 08/11/2021  BILITOT 0.3 08/11/2021   ALKPHOS 66 08/11/2021   AST 14 08/11/2021   ALT 17 08/11/2021   PROT 7.3 08/11/2021   ALBUMIN 4.7 08/11/2021   CALCIUM 9.8 08/11/2021   GFR 81.09 08/11/2021   Lab Results  Component Value Date   CHOL 205 (H) 08/11/2021   Lab Results  Component Value Date   HDL 62.30 08/11/2021   Lab Results  Component Value Date   LDLCALC 125 (H) 08/11/2021   Lab Results  Component Value Date   TRIG 92.0 08/11/2021   Lab Results  Component Value Date   CHOLHDL 3 08/11/2021   No results found for: HGBA1C    Assessment & Plan:   Problem List Items Addressed This Visit       Cardiovascular and Mediastinum   Chronic migraine with aura    Chronic, stable.  She follows with Shawnie Dapper with neurology.  She only has 2 tablets left of her Topamax and does not have an appointment with neurology until May.  We will send her in a 90-day of Topamax 150 mg daily.  Follow-up with neurology in May.      Relevant Medications   topiramate (TOPAMAX) 50 MG tablet   DULoxetine (CYMBALTA) 60 MG capsule     Nervous and Auditory   Chronic left-sided low back pain with left-sided sciatica    Chronic, improved.  She states that pain has significantly improved with Cymbalta.  She only has an intermittent pain once in a great while.  We will continue Cymbalta, however increasing dose to 60 mg daily for mood as discussed below.  Follow-up with any concerns.      Relevant Medications   topiramate (TOPAMAX) 50 MG tablet   DULoxetine (CYMBALTA) 60 MG capsule     Other   Anxiety and depression - Primary    Chronic, improving.  She states that after starting the Cymbalta her anxiety and depression have improved, however could be better.  With some improvement with the Cymbalta will increase the dose to 60 mg daily.  Discussed possible side effects.  Follow-up in 4 to 6 weeks.      Relevant Medications   DULoxetine (CYMBALTA) 60 MG capsule    HSV-1 infection    Chronic, exacerbated.  We will send in a prescription for Valtrex to 1000 mg twice a day for 1 day.  Will give extra tablets for future flares.      Relevant Medications   valACYclovir (VALTREX) 1000 MG tablet   Other Visit Diagnoses     Need for Tdap vaccination       Tdap booster updated today.   Relevant Orders   Tdap vaccine greater than or equal to 7yo IM (Completed)       Meds ordered this encounter  Medications   topiramate (TOPAMAX) 50 MG tablet    Sig: Take 3 tablets (150 mg total) by mouth at bedtime. MUST BE SEEN FOR FURTHER REFILLS. CALL [phone].    Dispense:  270 tablet    Refill:  0    Must be seen for further refills.   DULoxetine (CYMBALTA) 60 MG capsule    Sig: Take 1 capsule (60 mg total) by mouth daily.    Dispense:  90 capsule    Refill:  0   valACYclovir (VALTREX) 1000 MG tablet    Sig: Take 2 tablets (2,000 mg total) by mouth 2 (two) times daily as needed for up to 10 days. Take 2 tablets in the morning and 2  tablets at night for 1 day prn outbreak    Dispense:  20 tablet    Refill:  0    Follow-up: Return in about 4 weeks (around 10/06/2021) for Anxiety, Depression can be virtual .    Gerre Scull, NP

## 2021-09-08 NOTE — Assessment & Plan Note (Signed)
Chronic, improved.  She states that pain has significantly improved with Cymbalta.  She only has an intermittent pain once in a great while.  We will continue Cymbalta, however increasing dose to 60 mg daily for mood as discussed below.  Follow-up with any concerns.

## 2021-09-08 NOTE — Assessment & Plan Note (Signed)
Chronic, improving.  She states that after starting the Cymbalta her anxiety and depression have improved, however could be better.  With some improvement with the Cymbalta will increase the dose to 60 mg daily.  Discussed possible side effects.  Follow-up in 4 to 6 weeks.

## 2021-09-09 ENCOUNTER — Other Ambulatory Visit (HOSPITAL_COMMUNITY): Payer: Self-pay

## 2021-09-09 MED ORDER — TOPIRAMATE 50 MG PO TABS
150.0000 mg | ORAL_TABLET | Freq: Every day | ORAL | 1 refills | Status: DC
  Filled 2021-09-09: qty 270, 90d supply, fill #0

## 2021-09-20 ENCOUNTER — Encounter: Payer: Self-pay | Admitting: Nurse Practitioner

## 2021-09-20 ENCOUNTER — Other Ambulatory Visit: Payer: Self-pay

## 2021-09-20 ENCOUNTER — Ambulatory Visit (INDEPENDENT_AMBULATORY_CARE_PROVIDER_SITE_OTHER): Payer: No Typology Code available for payment source | Admitting: Nurse Practitioner

## 2021-09-20 VITALS — BP 114/82 | HR 88 | Temp 98.1°F | Wt 193.8 lb

## 2021-09-20 DIAGNOSIS — K529 Noninfective gastroenteritis and colitis, unspecified: Secondary | ICD-10-CM | POA: Insufficient documentation

## 2021-09-20 MED ORDER — ONDANSETRON 4 MG PO TBDP: 4.0000 mg | ORAL_TABLET | Freq: Three times a day (TID) | ORAL | 0 refills | Status: AC | PRN

## 2021-09-20 NOTE — Patient Instructions (Signed)
It was great to see you!  Start with 1 sip of fluid like we discussed in the visit. If you keep that down, take 1 more sip after 30 minutes. If you are unable to keep any fluids down, I recommend you go to the ER.   I have sent in zofran for you to take every 8 hours as needed for nausea.   Let's follow-up if your symptoms worsen or don't improve.   Take care,  Rodman Pickle, NP

## 2021-09-20 NOTE — Assessment & Plan Note (Signed)
No red flags on exam. Skin turgor good, VSS. Discussed small sip of gatorade at a time, then wait 30 min then take another sip.  Zofran q8hr prn nausea/vomting.  ER precautions discussed. Work note provided. Follow up if symptoms don't improve or worsen.

## 2021-09-20 NOTE — Progress Notes (Signed)
Acute Office Visit  Subjective:    Patient ID: Alice Mcbride, female    DOB: [birth year], 36 y.o.   MRN: [id number]  Chief Complaint  Patient presents with   Follow-up    Pt c/o vomiting, stomach cramps and dehydration x1 day    HPI Patient is in today for nausea, vomiting since last night. Endorses headache. Last vomited an hour ago. Denies diarrhea and fevers. She has vomited 7 or 8 times since 6am. She thought it had subsided and drank a few sips of coke and it came back up. She also tried water and saltines and started vomiting again. Her symptoms are associated with generalized abdominal pain and cramping. She denies dizziness.   Past Medical History:  Diagnosis Date   Chronic low back pain    Migraine     Past Surgical History:  Procedure Laterality Date   WISDOM TOOTH EXTRACTION      Family History  Problem Relation Age of Onset   Hypertension Mother    Migraines Neg Hx     Social History   Socioeconomic History   Marital status: Significant Other    Spouse name: Deanna   Number of children: 0   Years of education: EMT certification   Highest education level: Not on file  Occupational History   Occupation: EMT    Comment: [employer]  Tobacco Use   Smoking status: Former    Types: Cigarettes    Quit date: 2011    Years since quitting: 12.1   Smokeless tobacco: Never  Vaping Use   Vaping Use: Every day  Substance and Sexual Activity   Alcohol use: Yes    Alcohol/week: 2.0 standard drinks    Types: 2 Cans of beer per week    Comment: occasionally   Drug use: No   Sexual activity: Yes    Partners: Female    Birth control/protection: None  Other Topics Concern   Not on file  Social History Narrative   Lives with her partner and her partner's two children.   Parents live in [place].   Right-handed   Caffeine: none    Social Determinants of Radio broadcast assistant Strain: Not on file  Food Insecurity: Not on file   Transportation Needs: Not on file  Physical Activity: Not on file  Stress: Not on file  Social Connections: Not on file  Intimate Partner Violence: Not on file    Outpatient Medications Prior to Visit  Medication Sig Dispense Refill   cyclobenzaprine (FLEXERIL) 10 MG tablet Take 1 tablet (10 mg total) by mouth 3 (three) times daily as needed for muscle spasms. 30 tablet 11   DULoxetine (CYMBALTA) 60 MG capsule Take 1 capsule (60 mg total) by mouth daily. 90 capsule 0   IBUPROFEN PO Take by mouth as needed.     topiramate (TOPAMAX) 50 MG tablet Take 3 tablets (150 mg total) by mouth at bedtime. MUST BE SEEN FOR FURTHER REFILLS. CALL [phone]. 270 tablet 0   topiramate (TOPAMAX) 50 MG tablet Take 3 tablets (150 mg total) by mouth at bedtime. 270 tablet 1   No facility-administered medications prior to visit.    Allergies  Allergen Reactions   Bee Venom     Review of Systems See pertinent positives and negatives per HPI.    Objective:    Physical Exam Vitals and nursing note reviewed.  Constitutional:      General: She is not in acute distress.  Appearance: Normal appearance.  HENT:     Head: Normocephalic.  Eyes:     Conjunctiva/sclera: Conjunctivae normal.  Cardiovascular:     Rate and Rhythm: Normal rate and regular rhythm.     Pulses: Normal pulses.     Heart sounds: Normal heart sounds.  Pulmonary:     Effort: Pulmonary effort is normal.     Breath sounds: Normal breath sounds.  Abdominal:     Palpations: Abdomen is soft.     Tenderness: There is abdominal tenderness (RUQ, LUQ). There is no right CVA tenderness, left CVA tenderness, guarding or rebound. Negative signs include Rovsing's sign and McBurney's sign.  Musculoskeletal:     Cervical back: Normal range of motion.  Skin:    General: Skin is warm.  Neurological:     General: No focal deficit present.     Mental Status: She is alert and oriented to person, place, and time.  Psychiatric:         Mood and Affect: Mood normal.        Behavior: Behavior normal.        Thought Content: Thought content normal.        Judgment: Judgment normal.    BP 114/82    Pulse 88    Temp 98.1 F (36.7 C) (Oral)    Wt 193 lb 12.8 oz (87.9 kg)    SpO2 97%    BMI 28.62 kg/m  Wt Readings from Last 3 Encounters:  09/20/21 193 lb 12.8 oz (87.9 kg)  09/08/21 191 lb 6.4 oz (86.8 kg)  08/11/21 193 lb 6.4 oz (87.7 kg)    Health Maintenance Due  Topic Date Due   PAP SMEAR-Modifier  Never done   COVID-19 Vaccine (3 - Booster for Moderna series) 12/13/2020   INFLUENZA VACCINE  02/21/2021    There are no preventive care reminders to display for this patient.   Lab Results  Component Value Date   TSH 1.51 08/11/2021   Lab Results  Component Value Date   WBC 8.4 08/11/2021   HGB 13.8 08/11/2021   HCT 41.1 08/11/2021   MCV 89.4 08/11/2021   PLT 300.0 08/11/2021   Lab Results  Component Value Date   NA 140 08/11/2021   K 4.2 08/11/2021   CO2 29 08/11/2021   GLUCOSE 90 08/11/2021   BUN 9 08/11/2021   CREATININE 0.92 08/11/2021   BILITOT 0.3 08/11/2021   ALKPHOS 66 08/11/2021   AST 14 08/11/2021   ALT 17 08/11/2021   PROT 7.3 08/11/2021   ALBUMIN 4.7 08/11/2021   CALCIUM 9.8 08/11/2021   GFR 81.09 08/11/2021   Lab Results  Component Value Date   CHOL 205 (H) 08/11/2021   Lab Results  Component Value Date   HDL 62.30 08/11/2021   Lab Results  Component Value Date   LDLCALC 125 (H) 08/11/2021   Lab Results  Component Value Date   TRIG 92.0 08/11/2021   Lab Results  Component Value Date   CHOLHDL 3 08/11/2021   No results found for: HGBA1C     Assessment & Plan:   Problem List Items Addressed This Visit       Digestive   Gastroenteritis - Primary    No red flags on exam. Skin turgor good, VSS. Discussed small sip of gatorade at a time, then wait 30 min then take another sip.  Zofran q8hr prn nausea/vomting.  ER precautions discussed. Work note provided. Follow up  if symptoms don't improve or worsen.  Meds ordered this encounter  Medications   ondansetron (ZOFRAN-ODT) 4 MG disintegrating tablet    Sig: Take 1 tablet (4 mg total) by mouth every 8 (eight) hours as needed for nausea or vomiting.    Dispense:  30 tablet    Refill:  0     Charyl Dancer, NP

## 2021-10-05 NOTE — Progress Notes (Signed)
?[facility] ?[facility] ?[place] ?[place] [address] ?Dept: [phone] ?Dept Fax: [phone] ? ?Telephone Visit ? ?I connected with Denijah Wells [place] on 10/06/21 at  9:00 AM EDT by telephone and verified that I am speaking with the correct person using two identifiers. ? ?Location patient: Home ?Location provider: Clinic ?Persons participating in the virtual visit: Patient, Provider, [place] ? ?I discussed the limitations of evaluation and management by telemedicine and the availability of in person appointments. The patient expressed understanding and agreed to proceed. ? ?Chief Complaint  ?Patient presents with  ? Follow-up  ?  4 wk f/u anxiety & depression.   ? ? ?SUBJECTIVE: ? ?HPI: Alice Mcbride is a 36 y.o. female who presents to follow-up on anxiety and depression.  She was recently increased last visit to Cymbalta 60 mg daily.  She endorses that this has significantly helped her mood, and she has not had any side effects.  This is still also helping her back and she denies any back pain at this time.  She would like to continue on the 60 mg daily.  PHQ-9/GAD-7 unable to be completed today.  We will check this next visit ? ?Past Medical History:  ?Diagnosis Date  ? Chronic low back pain   ? Migraine   ? ? ?Past Surgical History:  ?Procedure Laterality Date  ? WISDOM TOOTH EXTRACTION    ? ? ?Family History  ?Problem Relation Age of Onset  ? Hypertension Mother   ? Migraines Neg Hx   ? ? ?Social History  ? ?Tobacco Use  ? Smoking status: Former  ?  Types: Cigarettes  ?  Quit date: 2011  ?  Years since quitting: 12.2  ? Smokeless tobacco: Never  ?Vaping Use  ? Vaping Use: Every day  ?Substance Use Topics  ? Alcohol use: Yes  ?  Alcohol/week: 2.0 standard drinks  ?  Types: 2 Cans of beer per week  ?  Comment: occasionally  ? Drug use: No  ? ? ? ?Current Outpatient Medications:  ?  cyclobenzaprine (FLEXERIL) 10 MG tablet, Take 1 tablet (10 mg total) by mouth  3 (three) times daily as needed for muscle spasms., Disp: 30 tablet, Rfl: 11 ?  DULoxetine (CYMBALTA) 60 MG capsule, Take 1 capsule (60 mg total) by mouth daily., Disp: 90 capsule, Rfl: 0 ?  IBUPROFEN PO, Take by mouth as needed., Disp: , Rfl:  ?  ondansetron (ZOFRAN-ODT) 4 MG disintegrating tablet, Take 1 tablet (4 mg total) by mouth every 8 (eight) hours as needed for nausea or vomiting., Disp: 30 tablet, Rfl: 0 ?  topiramate (TOPAMAX) 50 MG tablet, Take 3 tablets (150 mg total) by mouth at bedtime. MUST BE SEEN FOR FURTHER REFILLS. CALL [phone]., Disp: 270 tablet, Rfl: 0 ?  topiramate (TOPAMAX) 50 MG tablet, Take 3 tablets (150 mg total) by mouth at bedtime., Disp: 270 tablet, Rfl: 1 ? ?Allergies  ?Allergen Reactions  ? Bee Venom   ? ? ?ROS: See pertinent positives and negatives per HPI. ? ?OBSERVATIONS/OBJECTIVE: ? ?VITALS per patient if applicable: ?There were no vitals filed for this visit.  ? ?General: Alert and oriented x4 ?Pulmonary: Able to talk in complete sentences ? ?ASSESSMENT AND PLAN: ? ?Problem List Items Addressed This Visit   ? ?  ? Nervous and Auditory  ? Chronic left-sided low back pain with left-sided sciatica - Primary  ?  Chronic back pain with left-sided sciatica is doing well.  She is not  having any pain after starting Cymbalta daily.  We will continue current regimen.  Follow-up 6 months or sooner with any concerns ?  ?  ?  ? Other  ? Anxiety and depression  ?  Chronic, stable.  She is doing well on Cymbalta 60 mg daily.  We will continue this regimen.  Refill sent to the pharmacy.  Follow-up in 6 months. ?  ?  ? ? ?I discussed the assessment and treatment plan with the patient. The patient was provided an opportunity to ask questions and all were answered. The patient agreed with the plan and demonstrated an understanding of the instructions. ?  ?The patient was advised to call back or seek an in-person evaluation if the symptoms worsen or if the condition fails to improve as  anticipated. ? ?I spent 10 minutes on this telephone encounter. ? ?Charyl Dancer, NP  ?

## 2021-10-06 ENCOUNTER — Telehealth (INDEPENDENT_AMBULATORY_CARE_PROVIDER_SITE_OTHER): Payer: No Typology Code available for payment source | Admitting: Nurse Practitioner

## 2021-10-06 ENCOUNTER — Encounter: Payer: Self-pay | Admitting: Nurse Practitioner

## 2021-10-06 DIAGNOSIS — G8929 Other chronic pain: Secondary | ICD-10-CM

## 2021-10-06 DIAGNOSIS — F32A Depression, unspecified: Secondary | ICD-10-CM

## 2021-10-06 DIAGNOSIS — F419 Anxiety disorder, unspecified: Secondary | ICD-10-CM

## 2021-10-06 DIAGNOSIS — M5442 Lumbago with sciatica, left side: Secondary | ICD-10-CM

## 2021-10-06 NOTE — Assessment & Plan Note (Signed)
Chronic back pain with left-sided sciatica is doing well.  She is not having any pain after starting Cymbalta daily.  We will continue current regimen.  Follow-up 6 months or sooner with any concerns ?

## 2021-10-06 NOTE — Assessment & Plan Note (Signed)
Chronic, stable.  She is doing well on Cymbalta 60 mg daily.  We will continue this regimen.  Refill sent to the pharmacy.  Follow-up in 6 months. ?

## 2021-10-07 NOTE — Progress Notes (Signed)
I have left pt two vm and sent her a mychart message regarding this issue.  ?

## 2021-10-12 ENCOUNTER — Encounter: Payer: Self-pay | Admitting: Family Medicine

## 2021-10-12 ENCOUNTER — Ambulatory Visit: Payer: Self-pay | Admitting: Family Medicine

## 2021-10-12 NOTE — Progress Notes (Deleted)
? ? ?No chief complaint on file. ? ? ? ?HISTORY OF PRESENT ILLNESS: ? ?10/12/21 ALL:  ?Alice Mcbride is a 36 y.o. female here today for follow up for migraines. She was last seen 01/2019. She continues topiramate 150mg  at bedtime (PCP).  ? ? ?01/28/19 ALL (Mychart) ?Alice Mcbride is a 36 y.o. female here today for follow up of migraines. She has done very well on Aimovig 140 mg injections until the last few months.  She is also taking topiramate 150 mg at bedtime.  She reports that migraines were occurring about once per month and manageable until the past 3 to 4 days.  She reports that she has had a migraine that is unresponsive to typical treatment.  She does admit to taking ibuprofen regularly.  She has taken at least 600 mg every day.  Sometimes multiple times a day.  She reports that Maxalt does help her headache but makes her extremely tired.  She cannot take this and work.  She is doing well otherwise. ?  ?  ?History (copied from Dr 34 note on 12/28/2017) ?  ?Interval history 12/28/2017: she still has has 15 headache days. She has 8 migraines and 5 can be severe where she in bed has to take flexeril and sleep. Takes Topiramate 150mg  at bedtime. Helped with severity and frequency of migraines. Acute medication works. But still suffering from significant migraines. Discussed options such as increasing current meds, botox for migraines, trying other preventatives and cgrp. She does not want to take another med and not interested in botox would like cgrp. Will order but advised that in a year se may not qualify if we don;t try other medications. She would just like to try cgrp for now. She has accompanied nausea and neck tightness ?  ?Tried: topiramate IR, fioricet, flexeril, compazine, maxalt, imitrex, qudexy ?  ?HPI:  Alice Mcbride is a 36 y.o. female here as an urgent that an appointment for severe migraine. Last week she had 4-5 migraines otherwise she was doing well. Nothing changed. She had  migraines wed, fri and yesterday and she has had a migraine since yesterday which has been the severest in years. She went off of the daily meds and no longer has rebound or medication overuse headache. She is taking Topamax extended release with good results and no side effects discussed teratogenicity do not get pregnant on this medication. She has tried everything since yesterday, Imitrex, Compazine, Flexeril. Imitrex didn;t help. Compazine didn;t help. Flexeril didn;t help. Imitrex did not help. Migraines start slow. She took the imitrex right away and it did not help. She is on 100mg  topamax. Discussed at this point we'll change her acute management so that hopefully migraines don't progress like this. We'll changed to Relpax. Can take with Flexeril. ?  ?HPI:  Alice Mcbride is a 36 y.o. female here as a referral from Dr. for migraines worsening over the last 6 months in frequency and severity. 2 months ago she bent over and felt a sharp pain on the left side of her head and it happened again and it stayed. On the left side of the head. Turned into a constant migraine She had a tramadol shot and steroid shot at the end which helped for 6 hours and then came back. She went to [facility] on 3/23. [name] started flexeril at night to try and help her sleep, she was worn out due to the pain.The Fioricet helps sometimes She is taking Fioricet  and has had the bottle refilled.  ?  ?Her migraines include light sensitivity, nausea and vomiting, unilateral on either side or behind the eyes is very common, throbbing and pounding, smells don;t bother her but she is sensitive to light and sound. Unknown triggers. No food triggers. Most of the time she sleep well and get a goods night sleep. The flexeril helping her sleep. The headaches are waking her up for the last 2 months. She is having blurry vision. No snoring. She has daily headaches. Continuous headaches. At least 1/2 are migrainous. Headaches are  worse laying down. Very tired. For the last 6 months its worsening and she is having headaches at least 12-15 a month.  ?  ?Reviewed notes, labs and imaging from outside physicians, which showed:  ?  ?Reviewed labs CMP and CBC normal. ?  ?Reviewed primary care notes. 3 weeks ago after getting out of the shower she leaned over to dry her hair and when she stood up she felt a sharp pain in the left side of the head behind the ear that lasted about 30 minutes. Recurred a few days later with similar movement same pain and location. Has had pain constantly since then. Most of the pain is not the back of her head and at the top of the neck. Rates the pain as 5 out of 10 but can be 8 out of 10 at its worst. Ibuprofen helps a little, reducing the pain to 3 out of 10. Quality is throbbing ache that occasional sharp pain. Several days previous to last primary care appointment she presented to another facility, received Augmentin, steroid injection and Toradol injection for presumed sinusitis even though she didn't feel like she had any symptoms. She had minimal improvement. Rapid position changes cause some dizziness. She was treated with a muscle relaxant and Fioricet. ?  ? ?REVIEW OF SYSTEMS: Out of a complete 14 system review of symptoms, the patient complains only of the following symptoms, headaches and all other reviewed systems are negative. ? ? ?ALLERGIES: ?Allergies  ?Allergen Reactions  ? Bee Venom   ? ? ? ?HOME MEDICATIONS: ?Outpatient Medications Prior to Visit  ?Medication Sig Dispense Refill  ? cyclobenzaprine (FLEXERIL) 10 MG tablet Take 1 tablet (10 mg total) by mouth 3 (three) times daily as needed for muscle spasms. 30 tablet 11  ? DULoxetine (CYMBALTA) 60 MG capsule Take 1 capsule (60 mg total) by mouth daily. 90 capsule 0  ? IBUPROFEN PO Take by mouth as needed.    ? ondansetron (ZOFRAN-ODT) 4 MG disintegrating tablet Take 1 tablet (4 mg total) by mouth every 8 (eight) hours as needed for nausea or  vomiting. 30 tablet 0  ? topiramate (TOPAMAX) 50 MG tablet Take 3 tablets (150 mg total) by mouth at bedtime. MUST BE SEEN FOR FURTHER REFILLS. CALL [phone]. 270 tablet 0  ? topiramate (TOPAMAX) 50 MG tablet Take 3 tablets (150 mg total) by mouth at bedtime. 270 tablet 1  ? ?No facility-administered medications prior to visit.  ? ? ? ?PAST MEDICAL HISTORY: ?Past Medical History:  ?Diagnosis Date  ? Chronic low back pain   ? Migraine   ? ? ? ?PAST SURGICAL HISTORY: ?Past Surgical History:  ?Procedure Laterality Date  ? WISDOM TOOTH EXTRACTION    ? ? ? ?FAMILY HISTORY: ?Family History  ?Problem Relation Age of Onset  ? Hypertension Mother   ? Migraines Neg Hx   ? ? ? ?SOCIAL HISTORY: ?Social History  ? ?Socioeconomic History  ?  Marital status: Significant Other  ?  Spouse name: Deanna  ? Number of children: 0  ? Years of education: EMT certification  ? Highest education level: Not on file  ?Occupational History  ? Occupation: EMT  ?  Comment: [employer]  ?Tobacco Use  ? Smoking status: Former  ?  Types: Cigarettes  ?  Quit date: 2011  ?  Years since quitting: 12.2  ? Smokeless tobacco: Never  ?Vaping Use  ? Vaping Use: Every day  ?Substance and Sexual Activity  ? Alcohol use: Yes  ?  Alcohol/week: 2.0 standard drinks  ?  Types: 2 Cans of beer per week  ?  Comment: occasionally  ? Drug use: No  ? Sexual activity: Yes  ?  Partners: Female  ?  Birth control/protection: None  ?Other Topics Concern  ? Not on file  ?Social History Narrative  ? Lives with her partner and her partner's two children.  ? Parents live in [place].  ? Right-handed  ? Caffeine: none   ? ?Social Determinants of Health  ? ?Financial Resource Strain: Not on file  ?Food Insecurity: Not on file  ?Transportation Needs: Not on file  ?Physical Activity: Not on file  ?Stress: Not on file  ?Social Connections: Not on file  ?Intimate Partner Violence: Not on file  ? ? ? ?PHYSICAL EXAM ? ?There were no vitals filed for this visit. ?There  is no height or weight on file to calculate BMI. ? ?Generalized: Well developed, in no acute distress ? ?Cardiology: normal rate and rhythm, no murmur auscultated  ?Respiratory: clear to auscultation bilaterally

## 2021-10-12 NOTE — Telephone Encounter (Signed)
Error

## 2021-10-12 NOTE — Patient Instructions (Incomplete)

## 2021-11-10 ENCOUNTER — Encounter: Payer: Self-pay | Admitting: Nurse Practitioner

## 2021-11-10 ENCOUNTER — Telehealth (INDEPENDENT_AMBULATORY_CARE_PROVIDER_SITE_OTHER): Payer: 59 | Admitting: Nurse Practitioner

## 2021-11-10 VITALS — HR 74

## 2021-11-10 DIAGNOSIS — J0110 Acute frontal sinusitis, unspecified: Secondary | ICD-10-CM | POA: Diagnosis not present

## 2021-11-10 MED ORDER — AMOXICILLIN-POT CLAVULANATE 875-125 MG PO TABS: 1.0000 | ORAL_TABLET | Freq: Two times a day (BID) | ORAL | 0 refills | Status: DC

## 2021-11-10 MED ORDER — MONTELUKAST SODIUM 10 MG PO TABS: 10.0000 mg | ORAL_TABLET | Freq: Every day | ORAL | 1 refills | Status: DC

## 2021-11-10 NOTE — Progress Notes (Signed)
?[facility] ?[facility] ?[address] ?[place] [address] ?Dept: [phone] ?Dept Fax: [phone] ? ?Virtual Video Visit ? ?I connected with Morgan Keinath Loveday on 11/10/21 at 11:20 AM EDT by a video enabled telemedicine application and verified that I am speaking with the correct person using two identifiers. ? ?Location patient: Home ?Location provider: Clinic ?Persons participating in the virtual visit: Patient, Provider ? ?I discussed the limitations of evaluation and management by telemedicine and the availability of in person appointments. The patient expressed understanding and agreed to proceed. ? ?Chief Complaint  ?Patient presents with  ? Sinus Problem  ?  Pt c/o possible sinus infection, green mucous, sore throat, trouble swallowing x3 weeks. States OTC allergy medicine not working.   ? ? ?SUBJECTIVE: ? ?HPI: NEYRA PETTIE is a 36 y.o. female who presents with congestion, sore throat and sinus pain for 3 weeks.  ? ?UPPER RESPIRATORY TRACT INFECTION ? ?Fever: no ?Cough: yes ?Shortness of breath: no ?Wheezing: no ?Chest pain: no ?Chest tightness: no ?Chest congestion: no ?Nasal congestion: yes ?Runny nose: no ?Post nasal drip: yes ?Sneezing: yes ?Sore throat: yes ?Swollen glands: yes ?Sinus pressure: yes ?Headache: yes ?Face pain: yes ?Toothache: no ?Ear pain: yes bilateral ?Ear pressure: no bilateral ?Eyes red/itching:yes ?Eye drainage/crusting: no  ?Vomiting: no ?Rash: no ?Fatigue: yes ?Sick contacts: no ?Strep contacts: no  ?Context: worse ?Recurrent sinusitis: no ?Relief with OTC cold/cough medications:  some   ?Treatments attempted: sudafed, tylenol cold and sinus, neti pot, zyrtec ? ?Patient Active Problem List  ? Diagnosis Date Noted  ? Acute non-recurrent frontal sinusitis 11/10/2021  ? 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 Chronic left-sided low back pain with left-sided sciatica 08/11/2021  ? Anxiety and depression 08/11/2021  ? Chronic migraine with  aura 08/11/2021  ? ? ?Past Surgical History:  ?Procedure Laterality Date  ? WISDOM TOOTH EXTRACTION    ? ? ?Family History  ?Problem Relation Age of Onset  ? Hypertension Mother   ? Migraines Neg Hx   ? ? ?Social History  ? ?Tobacco Use  ? Smoking status: Former  ?  Types: Cigarettes  ?  Quit date: 2011  ?  Years since quitting: 12.3  ? Smokeless tobacco: Never  ?Vaping Use  ? Vaping Use: Every day  ?Substance Use Topics  ? Alcohol use: Yes  ?  Alcohol/week: 2.0 standard drinks  ?  Types: 2 Cans of beer per week  ?  Comment: occasionally  ? Drug use: No  ? ? ? ?Current Outpatient Medications:  ?  amoxicillin-clavulanate (AUGMENTIN) 875-125 MG tablet, Take 1 tablet by mouth 2 (two) times daily., Disp: 20 tablet, Rfl: 0 ?  montelukast (SINGULAIR) 10 MG tablet, Take 1 tablet (10 mg total) by mouth at bedtime., Disp: 90 tablet, Rfl: 1 ?  cyclobenzaprine (FLEXERIL) 10 MG tablet, Take 1 tablet (10 mg total) by mouth 3 (three) times daily as needed for muscle spasms., Disp: 30 tablet, Rfl: 11 ?  DULoxetine (CYMBALTA) 60 MG capsule, Take 1 capsule (60 mg total) by mouth daily., Disp: 90 capsule, Rfl: 0 ?  IBUPROFEN PO, Take by mouth as needed., Disp: , Rfl:  ?  ondansetron (ZOFRAN-ODT) 4 MG disintegrating tablet, Take 1 tablet (4 mg total) by mouth every 8 (eight) hours as needed for nausea or vomiting., Disp: 30 tablet, Rfl: 0 ?  topiramate (TOPAMAX) 50 MG tablet, Take 3 tablets (150 mg total) by mouth at bedtime. MUST BE SEEN FOR FURTHER REFILLS. CALL [phone]., Disp:  270 tablet, Rfl: 0 ?  topiramate (TOPAMAX) 50 MG tablet, Take 3 tablets (150 mg total) by mouth at bedtime., Disp: 270 tablet, Rfl: 1 ? ?Allergies  ?Allergen Reactions  ? Bee Venom   ? ? ?ROS: See pertinent positives and negatives per HPI. ? ?OBSERVATIONS/OBJECTIVE: ? ?VITALS per patient if applicable: ?Today's Vitals  ? 11/10/21 1137  ?Pulse: 74  ? ?There is no height or weight on file to calculate BMI. ?  ? ?GENERAL: Alert and oriented. Appears well  and in no acute distress. ? ?HEENT: Atraumatic. Conjunctiva clear. No obvious abnormalities on inspection of external nose and ears. ? ?NECK: Normal movements of the head and neck. ? ?LUNGS: On inspection, no signs of respiratory distress. Breathing rate appears normal. No obvious gross SOB, gasping or wheezing, and no conversational dyspnea. ? ?CV: No obvious cyanosis. ? ?MS: Moves all visible extremities without noticeable abnormality. ? ?PSYCH/NEURO: Pleasant and cooperative. No obvious depression or anxiety. Speech and thought processing grossly intact. ? ?ASSESSMENT AND PLAN: ? ?Problem List Items Addressed This Visit   ? ?  ? Respiratory  ? Acute non-recurrent frontal sinusitis - Primary  ?  Ongoing nasal congestion, sinus pressure, headache, sore throat for the last 3 weeks.  She states she has trouble with allergies, however this year has been worse.  We will start Augmentin twice a day for 10 days.  We will also trial Singulair 10 mg at bedtime to help with allergies.  She can continue doing the Nettie pot, Tylenol Cold and sinus, Sudafed as needed.  Encouraged her to drink plenty of fluids.  Follow-up if symptoms worsen or do not improve. ? ?  ?  ? Relevant Medications  ? amoxicillin-clavulanate (AUGMENTIN) 875-125 MG tablet  ? ?  ?I discussed the assessment and treatment plan with the patient. The patient was provided an opportunity to ask questions and all were answered. The patient agreed with the plan and demonstrated an understanding of the instructions. ?  ?The patient was advised to call back or seek an in-person evaluation if the symptoms worsen or if the condition fails to improve as anticipated. ? ?Time spent on call:  8 minutes with patient face to face via video conference. More than 50% of this time was spent in counseling and coordination of care. 5 minutes total spent in review of patient's record and preparation of their chart. ? ? ?Gerre Scull, NP  ?

## 2021-11-10 NOTE — Assessment & Plan Note (Signed)
Ongoing nasal congestion, sinus pressure, headache, sore throat for the last 3 weeks.  She states she has trouble with allergies, however this year has been worse.  We will start Augmentin twice a day for 10 days.  We will also trial Singulair 10 mg at bedtime to help with allergies.  She can continue doing the Nettie pot, Tylenol Cold and sinus, Sudafed as needed.  Encouraged her to drink plenty of fluids.  Follow-up if symptoms worsen or do not improve. ?

## 2021-11-10 NOTE — Patient Instructions (Signed)
It was great to see you! ? ?Start Augmentin, an antibiotic, twice a day for 10 days.  I recommend taking this with food.  He can also start Singulair 1 tablet at bedtime.  You can continue taking Tylenol Cold and sinus, Sudafed, your Nettie pot as needed.  Make sure you are drinking plenty of fluids. ? ?Let's follow-up if your symptoms do not improve or worsen. ? ?Take care, ? ?Vance Peper, NP ? ?

## 2021-11-11 ENCOUNTER — Telehealth: Payer: 59 | Admitting: Nurse Practitioner

## 2021-11-15 ENCOUNTER — Other Ambulatory Visit: Payer: Self-pay | Admitting: Nurse Practitioner

## 2021-11-15 NOTE — Telephone Encounter (Signed)
Chart supports rx refill ?Last ov:  ?Last refill: 10/24/21 ?

## 2021-11-18 ENCOUNTER — Encounter: Payer: Self-pay | Admitting: Nurse Practitioner

## 2021-11-18 MED ORDER — FLUCONAZOLE 150 MG PO TABS: 150.0000 mg | ORAL_TABLET | Freq: Once | ORAL | 0 refills | Status: AC

## 2021-11-22 NOTE — Patient Instructions (Signed)
Below is our plan: ? ?We will continue topiramate 150mg  daily. May continue refills through PCP but call me if headaches worsen.  ? ?Please make sure you are staying well hydrated. I recommend 50-60 ounces daily. Well balanced diet and regular exercise encouraged. Consistent sleep schedule with 6-8 hours recommended.  ? ?Please continue follow up with care team as directed.  ? ?Follow up with me as needed  ? ?You may receive a survey regarding today's visit. I encourage you to leave honest feed back as I do use this information to improve patient care. Thank you for seeing me today!  ? ? ?

## 2021-11-22 NOTE — Progress Notes (Signed)
? ? ?Chief Complaint  ?Patient presents with  ? Follow-up  ?  Rm 1, alone. Pt last seen on 01/27/2019. Pt reports doing well as long as she take her Topamax.   ? ? ?HISTORY OF PRESENT ILLNESS: ? ?11/24/21 ALL:  ?Alice Mcbride is a 36 y.o. female here today for follow up for migraines. She was last seen by video 01/2019. She was advised to continue topiramate 150mg  QHS, propranolol 60mg  daily and Amovig 140mg  monthly. was started for abortive therapy as rizatriptan made her sleepy. worked but switched to due to cost. Nurtec switched back to Bernita Raisin 11/2019 due to insurance change. Note 04/2019 reported she had stopped Amovig 01/2019, unclear why.  ? ?Since, she has continued topiramate 150mg  daily.  She feels that she is doing well. She has about 6 headache days per month. She may have 3-4 that are bad enough to take ibuprofen. She has not needed to take Ubrelvy. Amovig was effective but too expensive. Last rx for topiramate from PCP. ? ?01/28/19 ALL (Mychart): ?Alice Mcbride is a 36 y.o. female here today for follow up of migraines. She has done very well on Aimovig 140 mg injections until the last few months.  She is also taking topiramate 150 mg at bedtime.  She reports that migraines were occurring about once per month and manageable until the past 3 to 4 days.  She reports that she has had a migraine that is unresponsive to typical treatment.  She does admit to taking ibuprofen regularly.  She has taken at least 600 mg every day.  Sometimes multiple times a day.  She reports that Maxalt does help her headache but makes her extremely tired.  She cannot take this and work.  She is doing well otherwise. ?  ?History (copied from Dr note on 12/28/2017) ?  ?Interval history 12/28/2017: she still has has 15 headache days. She has 8 migraines and 5 can be severe where she in bed has to take flexeril and sleep. Takes Topiramate 150mg  at bedtime. Helped with severity and frequency of  migraines. Acute medication works. But still suffering from significant migraines. Discussed options such as increasing current meds, botox for migraines, trying other preventatives and cgrp. She does not want to take another med and not interested in botox would like cgrp. Will order but advised that in a year se may not qualify if we don;t try other medications. She would just like to try cgrp for now. She has accompanied nausea and neck tightness ?  ?Tried: topiramate IR, fioricet, flexeril, compazine, maxalt, imitrex, qudexy ?  ?HPI:  Alice Mcbride is a 36 y.o. female here as an urgent that an appointment for severe migraine. Last week she had 4-5 migraines otherwise she was doing well. Nothing changed. She had migraines wed, fri and yesterday and she has had a migraine since yesterday which has been the severest in years. She went off of the daily meds and no longer has rebound or medication overuse headache. She is taking Topamax extended release with good results and no side effects discussed teratogenicity do not get pregnant on this medication. She has tried everything since yesterday, Imitrex, Compazine, Flexeril. Imitrex didn;t help. Compazine didn;t help. Flexeril didn;t help. Imitrex did not help. Migraines start slow. She took the imitrex right away and it did not help. She is on 100mg  topamax. Discussed at this point we'll change her acute management so that hopefully migraines don't progress like this. We'll  changed to Relpax. Can take with Flexeril. ?  ?HPI:  Alice Mcbride is a 36 y.o. female here as a referral from Dr. Leotis Shames for migraines worsening over the last 6 months in frequency and severity. 2 months ago she bent over and felt a sharp pain on the left side of her head and it happened again and it stayed. On the left side of the head. Turned into a constant migraine She had a tramadol shot and steroid shot at the end which helped for 6 hours and then came back. She went to [facility] on  3/23. [name] started flexeril at night to try and help her sleep, she was worn out due to the pain.The Fioricet helps sometimes She is taking Fioricet and has had the bottle refilled.  ?  ?Her migraines include light sensitivity, nausea and vomiting, unilateral on either side or behind the eyes is very common, throbbing and pounding, smells don;t bother her but she is sensitive to light and sound. Unknown triggers. No food triggers. Most of the time she sleep well and get a goods night sleep. The flexeril helping her sleep. The headaches are waking her up for the last 2 months. She is having blurry vision. No snoring. She has daily headaches. Continuous headaches. At least 1/2 are migrainous. Headaches are worse laying down. Very tired. For the last 6 months its worsening and she is having headaches at least 12-15 a month.  ?  ?Reviewed notes, labs and imaging from outside physicians, which showed:  ?  ?Reviewed labs CMP and CBC normal. ?  ?Reviewed primary care notes. 3 weeks ago after getting out of the shower she leaned over to dry her hair and when she stood up she felt a sharp pain in the left side of the head behind the ear that lasted about 30 minutes. Recurred a few days later with similar movement same pain and location. Has had pain constantly since then. Most of the pain is not the back of her head and at the top of the neck. Rates the pain as 5 out of 10 but can be 8 out of 10 at its worst. Ibuprofen helps a little, reducing the pain to 3 out of 10. Quality is throbbing ache that occasional sharp pain. Several days previous to last primary care appointment she presented to another facility, received Augmentin, steroid injection and Toradol injection for presumed sinusitis even though she didn't feel like she had any symptoms. She had minimal improvement. Rapid position changes cause some dizziness. She was treated with a muscle relaxant and Fioricet. ? ? ?REVIEW OF SYSTEMS: Out of a complete 14  system review of symptoms, the patient complains only of the following symptoms, headaches and all other reviewed systems are negative. ? ? ?ALLERGIES: ?Allergies  ?Allergen Reactions  ? Bee Venom   ? ? ? ?HOME MEDICATIONS: ?Outpatient Medications Prior to Visit  ?Medication Sig Dispense Refill  ? cyclobenzaprine (FLEXERIL) 10 MG tablet Take 1 tablet (10 mg total) by mouth 3 (three) times daily as needed for muscle spasms. 30 tablet 11  ? DULoxetine (CYMBALTA) 60 MG capsule TAKE 1 CAPSULE BY MOUTH EVERY DAY 90 capsule 1  ? IBUPROFEN PO Take by mouth as needed.    ? montelukast (SINGULAIR) 10 MG tablet Take 1 tablet (10 mg total) by mouth at bedtime. (Patient taking differently: Take 10 mg by mouth as needed.) 90 tablet 1  ? ondansetron (ZOFRAN-ODT) 4 MG disintegrating tablet Take 1 tablet (4 mg  total) by mouth every 8 (eight) hours as needed for nausea or vomiting. 30 tablet 0  ? topiramate (TOPAMAX) 50 MG tablet Take 3 tablets (150 mg total) by mouth at bedtime. 270 tablet 1  ? amoxicillin-clavulanate (AUGMENTIN) 875-125 MG tablet Take 1 tablet by mouth 2 (two) times daily. 20 tablet 0  ? topiramate (TOPAMAX) 50 MG tablet Take 3 tablets (150 mg total) by mouth at bedtime. MUST BE SEEN FOR FURTHER REFILLS. CALL [phone]. 270 tablet 0  ? ?No facility-administered medications prior to visit.  ? ? ? ?PAST MEDICAL HISTORY: ?Past Medical History:  ?Diagnosis Date  ? Chronic low back pain   ? Migraine   ? ? ? ?PAST SURGICAL HISTORY: ?Past Surgical History:  ?Procedure Laterality Date  ? WISDOM TOOTH EXTRACTION    ? ? ? ?FAMILY HISTORY: ?Family History  ?Problem Relation Age of Onset  ? Hypertension Mother   ? Migraines Neg Hx   ? ? ? ?SOCIAL HISTORY: ?Social History  ? ?Socioeconomic History  ? Marital status: Significant Other  ?  Spouse name: Deanna  ? Number of children: 0  ? Years of education: EMT certification  ? Highest education level: Not on file  ?Occupational History  ? Occupation: EMT  ?  Comment: [employer]  ?Tobacco Use  ? Smoking status: Former  ?  Types: Cigarettes  ?  Quit date: 2011  ?  Years since quitting: 12.3  ? Smokeless tobacco: Never  ?Vaping Use  ? Vaping Use: Every day  ?Substanc

## 2021-11-24 ENCOUNTER — Encounter: Payer: Self-pay | Admitting: Family Medicine

## 2021-11-24 ENCOUNTER — Ambulatory Visit: Payer: 59 | Admitting: Family Medicine

## 2021-11-24 VITALS — BP 126/85 | HR 96 | Ht 69.0 in | Wt 197.5 lb

## 2021-11-24 DIAGNOSIS — G43709 Chronic migraine without aura, not intractable, without status migrainosus: Secondary | ICD-10-CM | POA: Diagnosis not present

## 2021-11-27 ENCOUNTER — Emergency Department (HOSPITAL_BASED_OUTPATIENT_CLINIC_OR_DEPARTMENT_OTHER)
Admission: EM | Admit: 2021-11-27 | Discharge: 2021-11-27 | Disposition: A | Discharge status: Home / Self Care | Payer: 59 | Attending: Emergency Medicine | Admitting: Emergency Medicine

## 2021-11-27 ENCOUNTER — Encounter (HOSPITAL_BASED_OUTPATIENT_CLINIC_OR_DEPARTMENT_OTHER): Payer: Self-pay | Admitting: Emergency Medicine

## 2021-11-27 ENCOUNTER — Emergency Department (HOSPITAL_BASED_OUTPATIENT_CLINIC_OR_DEPARTMENT_OTHER): Payer: 59

## 2021-11-27 ENCOUNTER — Other Ambulatory Visit: Payer: Self-pay

## 2021-11-27 DIAGNOSIS — S70312A Abrasion, left thigh, initial encounter: Secondary | ICD-10-CM | POA: Diagnosis not present

## 2021-11-27 DIAGNOSIS — M7989 Other specified soft tissue disorders: Secondary | ICD-10-CM | POA: Insufficient documentation

## 2021-11-27 DIAGNOSIS — M25562 Pain in left knee: Secondary | ICD-10-CM | POA: Diagnosis not present

## 2021-11-27 DIAGNOSIS — S40812A Abrasion of left upper arm, initial encounter: Secondary | ICD-10-CM | POA: Insufficient documentation

## 2021-11-27 DIAGNOSIS — Y92410 Unspecified street and highway as the place of occurrence of the external cause: Secondary | ICD-10-CM | POA: Insufficient documentation

## 2021-11-27 DIAGNOSIS — S30810A Abrasion of lower back and pelvis, initial encounter: Secondary | ICD-10-CM | POA: Insufficient documentation

## 2021-11-27 DIAGNOSIS — S9031XA Contusion of right foot, initial encounter: Secondary | ICD-10-CM | POA: Insufficient documentation

## 2021-11-27 DIAGNOSIS — R55 Syncope and collapse: Secondary | ICD-10-CM | POA: Insufficient documentation

## 2021-11-27 DIAGNOSIS — T148XXA Other injury of unspecified body region, initial encounter: Secondary | ICD-10-CM

## 2021-11-27 DIAGNOSIS — S99921A Unspecified injury of right foot, initial encounter: Secondary | ICD-10-CM | POA: Diagnosis present

## 2021-11-27 LAB — PREGNANCY, URINE: Preg Test, Ur: NEGATIVE

## 2021-11-27 MED ORDER — OXYCODONE-ACETAMINOPHEN 5-325 MG PO TABS: 1.0000 | ORAL_TABLET | Freq: Four times a day (QID) | ORAL | 0 refills | Status: DC | PRN

## 2021-11-27 MED ORDER — DOXYCYCLINE HYCLATE 100 MG PO CAPS: 100.0000 mg | ORAL_CAPSULE | Freq: Two times a day (BID) | ORAL | 0 refills | Status: AC

## 2021-11-27 MED ORDER — MORPHINE SULFATE (PF) 2 MG/ML IV SOLN
2.0000 mg | Freq: Once | INTRAVENOUS | Status: AC
  Administered 2021-11-27: 2 mg via INTRAMUSCULAR
  Filled 2021-11-27: qty 1

## 2021-11-27 NOTE — Discharge Instructions (Addendum)
It was a pleasure taking care of you today!  ? ?Your imaging studies were negative today in the ED.  You will be sent a prescription for doxycycline, take as prescribed.  You also be sent a prescription for Norco, take as prescribed.  Do not drive or operate heavy machinery with this medication.  You may apply over-the-counter bacitracin to the affected areas.  You will be given crutches to aid with walking.  Return to the emergency department for experiencing increasing/worsening redness streaking, fever, increasing pus drainage, tense leg muscles, worsening symptoms. ?
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
## 2021-11-27 NOTE — ED Notes (Signed)
Patient transported to CT 
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
## 2021-12-14 ENCOUNTER — Encounter: Payer: Self-pay | Admitting: Nurse Practitioner

## 2021-12-14 ENCOUNTER — Ambulatory Visit (INDEPENDENT_AMBULATORY_CARE_PROVIDER_SITE_OTHER): Payer: 59 | Admitting: Nurse Practitioner

## 2021-12-14 VITALS — BP 122/84 | HR 97 | Temp 96.8°F | Wt 187.8 lb

## 2021-12-14 DIAGNOSIS — M25562 Pain in left knee: Secondary | ICD-10-CM

## 2021-12-14 DIAGNOSIS — G8929 Other chronic pain: Secondary | ICD-10-CM | POA: Insufficient documentation

## 2021-12-14 MED ORDER — OXYCODONE-ACETAMINOPHEN 5-325 MG PO TABS: 1.0000 | ORAL_TABLET | Freq: Four times a day (QID) | ORAL | 0 refills | Status: DC | PRN

## 2021-12-14 MED ORDER — MELOXICAM 15 MG PO TABS: 15.0000 mg | ORAL_TABLET | Freq: Every day | ORAL | 0 refills | Status: AC
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
## 2021-12-14 NOTE — Patient Instructions (Signed)
It was great to see you!  You can take meloxicam daily and percocet as needed for pain.  I have ordered a MRI and am referring you to orthopedics.   Let's follow-up if your symptoms worsen or any concerns.   Take care,  Rodman Pickle, NP

## 2021-12-22 ENCOUNTER — Other Ambulatory Visit: Payer: Self-pay | Admitting: Nurse Practitioner

## 2021-12-22 ENCOUNTER — Other Ambulatory Visit (HOSPITAL_COMMUNITY): Payer: Self-pay

## 2021-12-22 MED ORDER — TOPIRAMATE 50 MG PO TABS: 150.0000 mg | ORAL_TABLET | Freq: Every day | ORAL | 1 refills | Status: DC

## 2021-12-22 NOTE — Telephone Encounter (Signed)
Please advise, see below. [place] for refill of Rx #: [id number]  topiramate (TOPAMAX) 50 MG tablet [id number]  to [id number] [place]  [address] [place] [address]  Phone:  [phone]  Fax:  [phone]  DEA #:  [id number]  ?   Last OV: 12/14/2021

## 2021-12-22 NOTE — Telephone Encounter (Signed)
Caller Name: Kamora Trawick  Call back phone #: [phone]  MEDICATION(S): Rx #: [id number]  topiramate (TOPAMAX) 50 MG tablet [[id number]]    Days of Med Remaining: none  Has the patient contacted their pharmacy (YES/NO)?  She was just seen and is under the impression she is getting this script.   Preferred [facility]acy: [id number] [place], [place] - [address]  [address] [place] [address]  Phone:  [phone]  Fax:  [phone]  DEA #:  [id number]  ~~~Please advise patient/caregiver to allow 2-3 business days to process RX refills.

## 2021-12-27 ENCOUNTER — Ambulatory Visit: Payer: 59 | Admitting: Orthopaedic Surgery

## 2021-12-27 DIAGNOSIS — M25562 Pain in left knee: Secondary | ICD-10-CM | POA: Diagnosis not present

## 2021-12-27 DIAGNOSIS — G8929 Other chronic pain: Secondary | ICD-10-CM | POA: Diagnosis not present
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
## 2022-01-16 ENCOUNTER — Encounter: Payer: Self-pay | Admitting: Nurse Practitioner

## 2022-01-16 MED ORDER — VALACYCLOVIR HCL 1 G PO TABS: 2000.0000 mg | ORAL_TABLET | Freq: Two times a day (BID) | ORAL | 2 refills | Status: DC | PRN

## 2022-05-11 ENCOUNTER — Other Ambulatory Visit: Payer: Self-pay | Admitting: Nurse Practitioner

## 2022-05-14 ENCOUNTER — Other Ambulatory Visit: Payer: Self-pay | Admitting: Nurse Practitioner

## 2022-06-09 ENCOUNTER — Other Ambulatory Visit: Payer: Self-pay | Admitting: Nurse Practitioner

## 2022-09-19 ENCOUNTER — Other Ambulatory Visit: Payer: Self-pay | Admitting: Nurse Practitioner

## 2023-01-29 ENCOUNTER — Other Ambulatory Visit: Payer: Self-pay | Admitting: Nurse Practitioner

## 2023-01-31 ENCOUNTER — Encounter: Payer: Self-pay | Admitting: Nurse Practitioner

## 2023-03-04 ENCOUNTER — Other Ambulatory Visit: Payer: Self-pay | Admitting: Nurse Practitioner

## 2023-03-07 ENCOUNTER — Other Ambulatory Visit: Payer: Self-pay | Admitting: Nurse Practitioner

## 2023-03-07 NOTE — Telephone Encounter (Signed)
Requesting: MONTELUKAST SOD 10 MG TABLET  Last Visit: 12/14/2021 Next Visit: Visit date not found Last Refill: 05/16/2022  Please Advise

## 2023-06-15 ENCOUNTER — Encounter: Payer: Self-pay | Admitting: Nurse Practitioner

## 2023-06-27 ENCOUNTER — Other Ambulatory Visit: Payer: Self-pay | Admitting: Nurse Practitioner

## 2023-07-31 ENCOUNTER — Other Ambulatory Visit: Payer: Self-pay | Admitting: Nurse Practitioner

## 2023-07-31 NOTE — Telephone Encounter (Signed)
I called patient and LVM to return call.

## 2023-08-01 NOTE — Telephone Encounter (Signed)
 LVM for patient to return call.

## 2023-08-04 ENCOUNTER — Other Ambulatory Visit: Payer: Self-pay | Admitting: Nurse Practitioner

## 2023-08-06 NOTE — Telephone Encounter (Signed)
 Requesting: DULOXETINE HCL DR 60 MG CAP  Last Visit: 12/14/2021 Next Visit: Visit date not found Last Refill: 06/27/2023  Please Advise

## 2023-08-08 ENCOUNTER — Ambulatory Visit: Payer: Medicaid Other | Admitting: Nurse Practitioner

## 2023-08-16 ENCOUNTER — Other Ambulatory Visit: Payer: Self-pay | Admitting: Nurse Practitioner

## 2023-08-16 NOTE — Telephone Encounter (Signed)
Requesting: TOPIRAMATE 50 MG TABLET  Last Visit: 12/14/2021 Next Visit: Visit date not found Last Refill: 01/31/2023  Please Advise   Patient needs an appointment

## 2023-09-24 ENCOUNTER — Other Ambulatory Visit: Payer: Self-pay | Admitting: Nurse Practitioner

## 2023-09-24 MED ORDER — DULOXETINE HCL 60 MG PO CPEP: 60.0000 mg | ORAL_CAPSULE | Freq: Every day | ORAL | 0 refills | Status: DC

## 2023-09-24 NOTE — Telephone Encounter (Signed)
 Copied from CRM [id number]. Topic: Clinical - Medication Refill >> Sep 24, 2023  9:54 AM Thomes Dinning wrote: Most Recent Primary Care Visit:  Provider: Rodman Pickle A  Department: [facility]  Visit Type: OFFICE VISIT  Date: 12/14/2021  Medication: DULoxetine (CYMBALTA) 60 MG capsule   Has the patient contacted their pharmacy? Yes (Agent: If no, request that the patient contact the pharmacy for the refill. If patient does not wish to contact the pharmacy document the reason why and proceed with request.) (Agent: If yes, when and what did the pharmacy advise?)  Is this the correct pharmacy for this prescription? Yes If no, delete pharmacy and type the correct one.  This is the patient's preferred pharmacy:  [id number] [place], [place] - [address] [place] [address] Phone: [phone] Fax: [phone]  [facility] [id number] [place], [place] [facility] [address] [place] [address] Phone: [phone] Fax: [phone]  [facility] [id number] [place], [place] - [address] [place] [address] Phone: [phone] Fax: [phone]   Has the prescription been filled recently? No  Is the patient out of the medication? Yes  Has the patient been seen for an appointment in the last year OR does the patient have an upcoming appointment? Yes  Can we respond through MyChart? Yes  Agent: Please be advised that Rx refills may take up to 3 business days. We ask that you follow-up with your pharmacy.

## 2023-09-24 NOTE — Telephone Encounter (Signed)
 Medication: Duloxetine (Cymbalta) 60 mg  Directions: 1 tablet by mouth daily  Last given: 06/27/23 Number refills: 0 Last o/v: 12/14/21 Follow up:  Labs:

## 2023-10-05 ENCOUNTER — Ambulatory Visit: Admitting: Nurse Practitioner

## 2023-10-05 ENCOUNTER — Other Ambulatory Visit (HOSPITAL_COMMUNITY)
Admission: RE | Admit: 2023-10-05 | Discharge: 2023-10-05 | Disposition: A | Discharge status: Home / Self Care | Source: Ambulatory Visit | Attending: Nurse Practitioner | Admitting: Nurse Practitioner

## 2023-10-05 ENCOUNTER — Encounter: Payer: Self-pay | Admitting: Nurse Practitioner

## 2023-10-05 VITALS — BP 126/80 | HR 87 | Temp 97.0°F | Ht 69.0 in | Wt 197.2 lb

## 2023-10-05 DIAGNOSIS — F419 Anxiety disorder, unspecified: Secondary | ICD-10-CM | POA: Diagnosis not present

## 2023-10-05 DIAGNOSIS — G8929 Other chronic pain: Secondary | ICD-10-CM

## 2023-10-05 DIAGNOSIS — M5442 Lumbago with sciatica, left side: Secondary | ICD-10-CM

## 2023-10-05 DIAGNOSIS — G2581 Restless legs syndrome: Secondary | ICD-10-CM | POA: Diagnosis not present

## 2023-10-05 DIAGNOSIS — J0110 Acute frontal sinusitis, unspecified: Secondary | ICD-10-CM

## 2023-10-05 DIAGNOSIS — L918 Other hypertrophic disorders of the skin: Secondary | ICD-10-CM

## 2023-10-05 DIAGNOSIS — Z0001 Encounter for general adult medical examination with abnormal findings: Secondary | ICD-10-CM

## 2023-10-05 DIAGNOSIS — M25562 Pain in left knee: Secondary | ICD-10-CM

## 2023-10-05 DIAGNOSIS — E7800 Pure hypercholesterolemia, unspecified: Secondary | ICD-10-CM

## 2023-10-05 DIAGNOSIS — G43E09 Chronic migraine with aura, not intractable, without status migrainosus: Secondary | ICD-10-CM

## 2023-10-05 DIAGNOSIS — Z124 Encounter for screening for malignant neoplasm of cervix: Secondary | ICD-10-CM

## 2023-10-05 DIAGNOSIS — F32A Depression, unspecified: Secondary | ICD-10-CM

## 2023-10-05 DIAGNOSIS — Z0000 Encounter for general adult medical examination without abnormal findings: Secondary | ICD-10-CM | POA: Insufficient documentation

## 2023-10-05 LAB — CBC WITH DIFFERENTIAL/PLATELET
Basophils Absolute: 0 10*3/uL (ref 0.0–0.1)
Basophils Relative: 0.4 % (ref 0.0–3.0)
Eosinophils Absolute: 0.2 10*3/uL (ref 0.0–0.7)
Eosinophils Relative: 1.7 % (ref 0.0–5.0)
HCT: 38.9 % (ref 36.0–46.0)
Hemoglobin: 13.3 g/dL (ref 12.0–15.0)
Lymphocytes Relative: 19.6 % (ref 12.0–46.0)
Lymphs Abs: 2 10*3/uL (ref 0.7–4.0)
MCHC: 34.2 g/dL (ref 30.0–36.0)
MCV: 89.5 fl (ref 78.0–100.0)
Monocytes Absolute: 0.8 10*3/uL (ref 0.1–1.0)
Monocytes Relative: 7.5 % (ref 3.0–12.0)
Neutro Abs: 7.3 10*3/uL (ref 1.4–7.7)
Neutrophils Relative %: 70.8 % (ref 43.0–77.0)
Platelets: 315 10*3/uL (ref 150.0–400.0)
RBC: 4.35 Mil/uL (ref 3.87–5.11)
RDW: 13.1 % (ref 11.5–15.5)
WBC: 10.3 10*3/uL (ref 4.0–10.5)

## 2023-10-05 LAB — LIPID PANEL
Cholesterol: 203 mg/dL — ABNORMAL HIGH (ref 0–200)
HDL: 51.8 mg/dL (ref >39.00)
LDL Cholesterol: 121 mg/dL — ABNORMAL HIGH (ref 0–99)
NonHDL: 150.88
Total CHOL/HDL Ratio: 4
Triglycerides: 150 mg/dL — ABNORMAL HIGH (ref 0.0–149.0)
VLDL: 30 mg/dL (ref 0.0–40.0)

## 2023-10-05 LAB — COMPREHENSIVE METABOLIC PANEL
ALT: 61 U/L — ABNORMAL HIGH (ref 0–35)
AST: 37 U/L (ref 0–37)
Albumin: 4.6 g/dL (ref 3.5–5.2)
Alkaline Phosphatase: 74 U/L (ref 39–117)
BUN: 11 mg/dL (ref 6–23)
CO2: 29 meq/L (ref 19–32)
Calcium: 9.6 mg/dL (ref 8.4–10.5)
Chloride: 104 meq/L (ref 96–112)
Creatinine, Ser: 0.78 mg/dL (ref 0.40–1.20)
GFR: 97.38 mL/min (ref >60.00)
Glucose, Bld: 79 mg/dL (ref 70–99)
Potassium: 3.9 meq/L (ref 3.5–5.1)
Sodium: 140 meq/L (ref 135–145)
Total Bilirubin: 0.4 mg/dL (ref 0.2–1.2)
Total Protein: 7.2 g/dL (ref 6.0–8.3)

## 2023-10-05 LAB — TSH: TSH: 1.62 u[IU]/mL (ref 0.35–5.50)

## 2023-10-05 LAB — VITAMIN B12: Vitamin B-12: 230 pg/mL (ref 211–911)

## 2023-10-05 LAB — VITAMIN D 25 HYDROXY (VIT D DEFICIENCY, FRACTURES): VITD: 16.43 ng/mL — ABNORMAL LOW (ref 30.00–100.00)

## 2023-10-05 LAB — FERRITIN: Ferritin: 221.4 ng/mL (ref 10.0–291.0)

## 2023-10-05 LAB — IRON: Iron: 90 ug/dL (ref 42–145)

## 2023-10-05 MED ORDER — GABAPENTIN 300 MG PO CAPS: 300.0000 mg | ORAL_CAPSULE | Freq: Every day | ORAL | 1 refills | Status: DC

## 2023-10-05 MED ORDER — DULOXETINE HCL 60 MG PO CPEP
60.0000 mg | ORAL_CAPSULE | Freq: Every day | ORAL | 3 refills | Status: AC
Start: 2023-10-05 — End: ?

## 2023-10-05 MED ORDER — VALACYCLOVIR HCL 1 G PO TABS: 2000.0000 mg | ORAL_TABLET | Freq: Two times a day (BID) | ORAL | 2 refills | Status: AC | PRN

## 2023-10-05 MED ORDER — MONTELUKAST SODIUM 10 MG PO TABS: 10.0000 mg | ORAL_TABLET | Freq: Every day | ORAL | 3 refills | Status: AC

## 2023-10-05 MED ORDER — VITAMIN D (ERGOCALCIFEROL) 1.25 MG (50000 UNIT) PO CAPS: 50000.0000 [IU] | ORAL_CAPSULE | ORAL | 0 refills | Status: DC

## 2023-10-05 MED ORDER — AMOXICILLIN-POT CLAVULANATE 875-125 MG PO TABS: 1.0000 | ORAL_TABLET | Freq: Two times a day (BID) | ORAL | 0 refills | Status: DC

## 2023-10-05 MED ORDER — TOPIRAMATE 50 MG PO TABS: 150.0000 mg | ORAL_TABLET | Freq: Every day | ORAL | 1 refills | Status: DC

## 2023-10-05 NOTE — Addendum Note (Signed)
 Addended by: Rodman Pickle A on: 10/05/2023 02:44 PM   Modules accepted: Orders

## 2023-10-05 NOTE — Assessment & Plan Note (Addendum)
 Chronic, stable.  She is doing well on Cymbalta 60 mg daily.  We will continue this regimen.  Refill sent to the pharmacy.  Follow-up in 1 year.

## 2023-10-05 NOTE — Assessment & Plan Note (Signed)
 Chronic back pain with left-sided sciatica is doing well.  She is not having any pain after starting Cymbalta daily.  Continue duloxetine 60mg  daily.  Follow-up in 1 year.

## 2023-10-05 NOTE — Assessment & Plan Note (Signed)
 Start augmentin BID x10  days. Continue singulair and OTC sinus medications. Encourage fluids.

## 2023-10-05 NOTE — Assessment & Plan Note (Signed)
 Chronic, stable.  She follows with Shawnie Dapper with neurology.  Continue topamax 150mg  daily. Refill sent to pharmacy.

## 2023-10-05 NOTE — Progress Notes (Signed)
 BP 126/80 (BP Location: Left Arm, Patient Position: Sitting, Cuff Size: Normal)   Pulse 87   Temp (!) 97 F (36.1 C)   Ht 5\' 9"  (1.753 m)   Wt 197 lb 3.2 oz (89.4 kg)   LMP 09/13/2023 (Exact Date)   SpO2 100%   BMI 29.12 kg/m    Subjective:    Patient ID: Alice Mcbride, female    DOB: 1986/07/19, 38 y.o.   MRN: [id number]  CC: Chief Complaint  Patient presents with   Annual Exam    With lab work-patient is not fasting, Rx refills    HPI: Alice Mcbride is a 38 y.o. female presenting on 10/05/2023 for comprehensive medical examination. Current medical complaints include: restless leg, sinus pain  She has been having trouble with insomnia due to having a hard time falling asleep and staying asleep. She also notes that she will wake up with leg pain and feel like she needs to walk around. She has tried some vitamin supplements which have not helped.   She also noticed sinus pain, congestion, and cough for 2 weeks. She denies fevers. This is associated with headache and ear pressure. She has been taking singulair, allegra, and cold/flu day/night medicine which has not helped. She denies chest pain and shortness of breath.   She currently lives with: fiance Menopausal Symptoms: no  Depression and Anxiety Screen done today and results listed below:     10/05/2023   11:31 AM 09/08/2021   11:08 AM 08/11/2021    1:41 PM 12/11/2016    5:22 PM 10/13/2016    8:26 AM  Depression screen PHQ 2/9  Decreased Interest 0 1 1 0 0  Down, Depressed, Hopeless 1 1 1  0 0  PHQ - 2 Score 1 2 2  0 0  Altered sleeping 2 2 3     Tired, decreased energy 2 2 1     Change in appetite 0 0 2    Feeling bad or failure about yourself  0 1 0    Trouble concentrating 0 0 0    Moving slowly or fidgety/restless 0 0 0    Suicidal thoughts 0 0 0    PHQ-9 Score 5 7 8     Difficult doing work/chores Somewhat difficult Somewhat difficult Somewhat difficult        10/05/2023   11:31 AM 09/08/2021   11:08 AM  08/11/2021    1:41 PM  GAD 7 : Generalized Anxiety Score  Nervous, Anxious, on Edge 1 2 1   Control/stop worrying 0 2 2  Worry too much - different things 1 2 2   Trouble relaxing 2 1 1   Restless 1 1 0  Easily annoyed or irritable 1 2 0  Afraid - awful might happen 0 1 0  Total GAD 7 Score 6 11 6   Anxiety Difficulty Somewhat difficult Somewhat difficult Somewhat difficult    The patient does not have a history of falls. I did not complete a risk assessment for falls. A plan of care for falls was not documented.   Past Medical History:  Past Medical History:  Diagnosis Date   Chronic low back pain    Migraine     Surgical History:  Past Surgical History:  Procedure Laterality Date   WISDOM TOOTH EXTRACTION      Medications:  Current Outpatient Medications on File Prior to Visit  Medication Sig   IBUPROFEN PO Take by mouth as needed.   ondansetron (ZOFRAN-ODT) 4 MG disintegrating tablet Take 1  tablet (4 mg total) by mouth every 8 (eight) hours as needed for nausea or vomiting.   cyclobenzaprine (FLEXERIL) 10 MG tablet Take 1 tablet (10 mg total) by mouth 3 (three) times daily as needed for muscle spasms. (Patient not taking: Reported on 10/05/2023)   meloxicam (MOBIC) 15 MG tablet Take 1 tablet (15 mg total) by mouth daily. (Patient not taking: Reported on 10/05/2023)   No current facility-administered medications on file prior to visit.    Allergies:  Allergies  Allergen Reactions   Bee Venom     Social History:  Social History   Socioeconomic History   Marital status: Significant Other    Spouse name: Deanna   Number of children: 0   Years of education: EMT certification   Highest education level: Not on file  Occupational History   Occupation: EMT    Comment: [employer]  Tobacco Use   Smoking status: Former    Current packs/day: 0.00    Types: Cigarettes    Quit date: 2011    Years since quitting: 14.2   Smokeless tobacco: Never  Vaping Use    Vaping status: Every Day   Substances: Nicotine  Substance and Sexual Activity   Alcohol use: Yes    Alcohol/week: 2.0 standard drinks of alcohol    Types: 2 Cans of beer per week    Comment: occasionally   Drug use: No   Sexual activity: Yes    Partners: Female    Birth control/protection: None  Other Topics Concern   Not on file  Social History Narrative   Lives with her partner and her partner's two children.   Parents live in [place].   Right-handed   Caffeine: none    Social Drivers of Corporate investment banker Strain: Not on file  Food Insecurity: Not on file  Transportation Needs: Not on file  Physical Activity: Not on file  Stress: Not on file  Social Connections: Unknown (11/22/2021)   Received from [facility]   Social Network    Social Network: Not on file  Intimate Partner Violence: Unknown (10/24/2021)   Received from [facility], [facility]   HITS    Physically Hurt: Not on file    Insult or Talk Down To: Not on file    Threaten Physical Harm: Not on file    Scream or Curse: Not on file   Social History   Tobacco Use  Smoking Status Former   Current packs/day: 0.00   Types: Cigarettes   Quit date: 2011   Years since quitting: 14.2  Smokeless Tobacco Never   Social History   Substance and Sexual Activity  Alcohol Use Yes   Alcohol/week: 2.0 standard drinks of alcohol   Types: 2 Cans of beer per week   Comment: occasionally    Family History:  Family History  Problem Relation Age of Onset   Hypertension Mother    Migraines Neg Hx     Past medical history, surgical history, medications, allergies, family history and social history reviewed with patient today and changes made to appropriate areas of the chart.   Review of Systems  Constitutional: Negative.   HENT:  Positive for congestion, ear pain and sinus pain. Negative for sore throat.   Eyes: Negative.   Respiratory:  Positive for cough. Negative for  shortness of breath.   Cardiovascular: Negative.   Gastrointestinal: Negative.   Genitourinary: Negative.   Musculoskeletal:  Positive for back pain and joint pain (  left knee).  Skin: Negative.   Neurological: Negative.   Psychiatric/Behavioral: Negative.     All other ROS negative except what is listed above and in the HPI.      Objective:    BP 126/80 (BP Location: Left Arm, Patient Position: Sitting, Cuff Size: Normal)   Pulse 87   Temp (!) 97 F (36.1 C)   Ht 5\' 9"  (1.753 m)   Wt 197 lb 3.2 oz (89.4 kg)   LMP 09/13/2023 (Exact Date)   SpO2 100%   BMI 29.12 kg/m   Wt Readings from Last 3 Encounters:  10/05/23 197 lb 3.2 oz (89.4 kg)  12/14/21 187 lb 12.8 oz (85.2 kg)  11/27/21 197 lb (89.4 kg)    Physical Exam Vitals and nursing note reviewed. Exam conducted with a chaperone present.  Constitutional:      General: She is not in acute distress.    Appearance: Normal appearance.  HENT:     Head: Normocephalic and atraumatic.     Right Ear: Ear canal and external ear normal. A middle ear effusion is present. Tympanic membrane is not erythematous.     Left Ear: Ear canal and external ear normal. A middle ear effusion is present. Tympanic membrane is erythematous.  Eyes:     Conjunctiva/sclera: Conjunctivae normal.  Cardiovascular:     Rate and Rhythm: Normal rate and regular rhythm.     Pulses: Normal pulses.     Heart sounds: Normal heart sounds.  Pulmonary:     Effort: Pulmonary effort is normal.     Breath sounds: Normal breath sounds.  Abdominal:     Palpations: Abdomen is soft.     Tenderness: There is no abdominal tenderness.  Genitourinary:    General: Normal vulva.     Exam position: Lithotomy position.     Labia:        Right: No rash, tenderness or lesion.        Left: No rash, tenderness or lesion.      Vagina: Normal.     Cervix: Normal.     Uterus: Normal.      Adnexa: Right adnexa normal and left adnexa normal.  Musculoskeletal:         General: Normal range of motion.     Cervical back: Normal range of motion and neck supple.     Right lower leg: No edema.     Left lower leg: No edema.  Lymphadenopathy:     Cervical: No cervical adenopathy.  Skin:    General: Skin is warm and dry.  Neurological:     General: No focal deficit present.     Mental Status: She is alert and oriented to person, place, and time.     Cranial Nerves: No cranial nerve deficit.     Coordination: Coordination normal.     Gait: Gait normal.  Psychiatric:        Mood and Affect: Mood normal.        Behavior: Behavior normal.        Thought Content: Thought content normal.        Judgment: Judgment normal.     Results for orders placed or performed during the hospital encounter of 11/27/21  Pregnancy, urine   Collection Time: 11/27/21 10:13 PM  Result Value Ref Range   Preg Test, Ur NEGATIVE NEGATIVE      Assessment & Plan:   Problem List Items Addressed This Visit       Cardiovascular and Mediastinum  Chronic migraine with aura   Chronic, stable.  She follows with Shawnie Dapper with neurology.  Continue topamax 150mg  daily. Refill sent to pharmacy.      Relevant Medications   DULoxetine (CYMBALTA) 60 MG capsule   topiramate (TOPAMAX) 50 MG tablet   gabapentin (NEURONTIN) 300 MG capsule     Respiratory   Acute non-recurrent frontal sinusitis   Start augmentin BID x10  days. Continue singulair and OTC sinus medications. Encourage fluids.       Relevant Medications   valACYclovir (VALTREX) 1000 MG tablet   amoxicillin-clavulanate (AUGMENTIN) 875-125 MG tablet     Nervous and Auditory   Chronic left-sided low back pain with left-sided sciatica   Chronic back pain with left-sided sciatica is doing well.  She is not having any pain after starting Cymbalta daily.  Continue duloxetine 60mg  daily.  Follow-up in 1 year.       Relevant Medications   DULoxetine (CYMBALTA) 60 MG capsule   topiramate (TOPAMAX) 50 MG tablet    gabapentin (NEURONTIN) 300 MG capsule     Other   Anxiety and depression   Chronic, stable.  She is doing well on Cymbalta 60 mg daily.  We will continue this regimen.  Refill sent to the pharmacy.  Follow-up in 1 year.       Relevant Medications   DULoxetine (CYMBALTA) 60 MG capsule   Chronic pain of left knee   She notes that she has a meniscus tear but is trying to hold off on surgery. Continue ibuprofen and tylenol as needed.       Relevant Medications   DULoxetine (CYMBALTA) 60 MG capsule   topiramate (TOPAMAX) 50 MG tablet   gabapentin (NEURONTIN) 300 MG capsule   Routine general medical examination at a health care facility - Primary   Health maintenance reviewed and updated. Discussed nutrition, exercise. Follow-up 1 year.        Restless leg syndrome   Will check CBC, iron, ferritin, vitamin B12, vitamin D and treat based on results. Will also start gabapentin 300mg  at bedtime. Discussed possible side effects. Follow-up in 3 months or sooner with concerns.       Relevant Orders   CBC with Differential/Platelet   TSH   Vitamin B12   VITAMIN D 25 Hydroxy (Vit-D Deficiency, Fractures)   Iron   Ferritin   Pure hypercholesterolemia   Chronic, stable. Check CMP, CBC, lipid panel today.       Relevant Orders   CBC with Differential/Platelet   Comprehensive metabolic panel   Lipid panel   Other Visit Diagnoses       Skin tag       Skin tag vs nevus to left scalp. Growing in size. Will place referral to dermatology.   Relevant Orders   Ambulatory referral to Dermatology     Screening for cervical cancer       Pap done today   Relevant Orders   Cytology - PAP        Follow up plan: Return in about 3 months (around 01/05/2024) for restless leg.   LABORATORY TESTING:  - Pap smear: pap done  IMMUNIZATIONS:   - Tdap: Tetanus vaccination status reviewed: last tetanus booster within 10 years. - Influenza: Up to date - Pneumovax: Not applicable - Prevnar: Not  applicable - HPV: Not applicable - Shingrix vaccine: Not applicable  SCREENING: -Mammogram: Not applicable  - Colonoscopy: Not applicable  - Bone Density: Not applicable   PATIENT COUNSELING:  Advised to take 1 mg of folate supplement per day if capable of pregnancy.   Sexuality: Discussed sexually transmitted diseases, partner selection, use of condoms, avoidance of unintended pregnancy  and contraceptive alternatives.   Advised to avoid cigarette smoking.  I discussed with the patient that most people either abstain from alcohol or drink within safe limits (<=14/week and <=4 drinks/occasion for males, <=7/weeks and <= 3 drinks/occasion for females) and that the risk for alcohol disorders and other health effects rises proportionally with the number of drinks per week and how often a drinker exceeds daily limits.  Discussed cessation/primary prevention of drug use and availability of treatment for abuse.   Diet: Encouraged to adjust caloric intake to maintain  or achieve ideal body weight, to reduce intake of dietary saturated fat and total fat, to limit sodium intake by avoiding high sodium foods and not adding table salt, and to maintain adequate dietary potassium and calcium preferably from fresh fruits, vegetables, and low-fat dairy products.    stressed the importance of regular exercise  Injury prevention: Discussed safety belts, safety helmets, smoke detector, smoking near bedding or upholstery.   Dental health: Discussed importance of regular tooth brushing, flossing, and dental visits.    NEXT PREVENTATIVE PHYSICAL DUE IN 1 YEAR. Return in about 3 months (around 01/05/2024) for restless leg.  Tamyrah Burbage A Milly Goggins

## 2023-10-05 NOTE — Assessment & Plan Note (Signed)
 She notes that she has a meniscus tear but is trying to hold off on surgery. Continue ibuprofen and tylenol as needed.

## 2023-10-05 NOTE — Assessment & Plan Note (Signed)
Chronic, stable.  Check CMP, CBC, lipid panel today. 

## 2023-10-05 NOTE — Assessment & Plan Note (Signed)
Health maintenance reviewed and updated. Discussed nutrition, exercise. Follow-up 1 year.

## 2023-10-05 NOTE — Assessment & Plan Note (Signed)
 Will check CBC, iron, ferritin, vitamin B12, vitamin D and treat based on results. Will also start gabapentin 300mg  at bedtime. Discussed possible side effects. Follow-up in 3 months or sooner with concerns.

## 2023-10-05 NOTE — Patient Instructions (Signed)
 It was great to see you!  I have placed a referral to dermatology, they will call to schedule  Start augmentin twice a day with food for 10 days  Keep taking sinus/allergy medication  Start gabapentin 1 capsule at bedtime, this may make you sleepy.  We are checking your labs today and will let you know the results via mychart/phone.   Let's follow-up in 3 months, sooner if you have concerns.  If a referral was placed today, you will be contacted for an appointment. Please note that routine referrals can sometimes take up to 3-4 weeks to process. Please call our office if you haven't heard anything after this time frame.  Take care,  Rodman Pickle, NP

## 2023-10-09 LAB — CYTOLOGY - PAP
Comment: NEGATIVE
Diagnosis: UNDETERMINED — AB
High risk HPV: NEGATIVE

## 2023-10-16 ENCOUNTER — Encounter: Payer: Self-pay | Admitting: Nurse Practitioner

## 2023-10-16 MED ORDER — TOPIRAMATE 50 MG PO TABS: 150.0000 mg | ORAL_TABLET | Freq: Every day | ORAL | 1 refills | Status: DC

## 2023-10-16 MED ORDER — FLUCONAZOLE 150 MG PO TABS: 150.0000 mg | ORAL_TABLET | Freq: Once | ORAL | 0 refills | Status: AC

## 2023-10-16 NOTE — Telephone Encounter (Signed)
 Requesting: Topamax 50mg  Last Visit: 10/05/2023 Next Visit: 01/07/2024 Last Refill: 10/05/2023 270 with 1 refill, so this was refilled.   Please Advise

## 2023-10-29 ENCOUNTER — Telehealth: Admitting: Physician Assistant

## 2023-10-29 DIAGNOSIS — R609 Edema, unspecified: Secondary | ICD-10-CM

## 2023-10-29 DIAGNOSIS — L539 Erythematous condition, unspecified: Secondary | ICD-10-CM

## 2023-10-29 DIAGNOSIS — T63301A Toxic effect of unspecified spider venom, accidental (unintentional), initial encounter: Secondary | ICD-10-CM

## 2023-10-29 MED ORDER — PREDNISONE 10 MG (21) PO TBPK
ORAL_TABLET | ORAL | 0 refills | Status: AC
Start: 2023-10-29 — End: ?

## 2023-10-29 MED ORDER — DOXYCYCLINE HYCLATE 100 MG PO TABS: 100.0000 mg | ORAL_TABLET | Freq: Two times a day (BID) | ORAL | 0 refills | Status: DC

## 2023-10-29 NOTE — Patient Instructions (Signed)
 Alice Mcbride, thank you for joining Alice Loveless, PA-C for today's virtual visit.  While this provider is not your primary care provider (PCP), if your PCP is located in our provider database this encounter information will be shared with them immediately following your visit.   A [facility] MyChart account gives you access to today's visit and all your visits, tests, and labs performed at [facility] " click here if you don't have a  MyChart account or go to [email or web address]  Consent: (Patient) Alice Mcbride provided verbal consent for this virtual visit at the beginning of the encounter.  Current Medications:  Current Outpatient Medications:    doxycycline (VIBRA-TABS) 100 MG tablet, Take 1 tablet (100 mg total) by mouth 2 (two) times daily., Disp: 14 tablet, Rfl: 0   predniSONE (STERAPRED UNI-PAK 21 TAB) 10 MG (21) TBPK tablet, 6 day taper take as directed on package instructions, Disp: 21 tablet, Rfl: 0   cyclobenzaprine (FLEXERIL) 10 MG tablet, Take 1 tablet (10 mg total) by mouth 3 (three) times daily as needed for muscle spasms. (Patient not taking: Reported on 10/05/2023), Disp: 30 tablet, Rfl: 11   DULoxetine (CYMBALTA) 60 MG capsule, Take 1 capsule (60 mg total) by mouth daily., Disp: 90 capsule, Rfl: 3   gabapentin (NEURONTIN) 300 MG capsule, Take 1 capsule (300 mg total) by mouth at bedtime., Disp: 30 capsule, Rfl: 1   IBUPROFEN PO, Take by mouth as needed., Disp: , Rfl:    meloxicam (MOBIC) 15 MG tablet, Take 1 tablet (15 mg total) by mouth daily. (Patient not taking: Reported on 10/05/2023), Disp: 30 tablet, Rfl: 0   montelukast (SINGULAIR) 10 MG tablet, Take 1 tablet (10 mg total) by mouth at bedtime., Disp: 90 tablet, Rfl: 3   ondansetron (ZOFRAN-ODT) 4 MG disintegrating tablet, Take 1 tablet (4 mg total) by mouth every 8 (eight) hours as needed for nausea or vomiting., Disp: 30 tablet, Rfl: 0   topiramate (TOPAMAX) 50 MG tablet,  Take 3 tablets (150 mg total) by mouth at bedtime., Disp: 270 tablet, Rfl: 1   valACYclovir (VALTREX) 1000 MG tablet, Take 2 tablets (2,000 mg total) by mouth 2 (two) times daily as needed. Take 2 tablets in the morning and 2 tablets at night for 1 day prn outbreak, Disp: 20 tablet, Rfl: 2   Vitamin D, Ergocalciferol, (DRISDOL) 1.25 MG (50000 UNIT) CAPS capsule, Take 1 capsule (50,000 Units total) by mouth every 7 (seven) days., Disp: 12 capsule, Rfl: 0   Medications ordered in this encounter:  Meds ordered this encounter  Medications   predniSONE (STERAPRED UNI-PAK 21 TAB) 10 MG (21) TBPK tablet    Sig: 6 day taper take as directed on package instructions    Dispense:  21 tablet    Refill:  0    Supervising Provider:   Merrilee Mcbride [[id number]]   doxycycline (VIBRA-TABS) 100 MG tablet    Sig: Take 1 tablet (100 mg total) by mouth 2 (two) times daily.    Dispense:  14 tablet    Refill:  0    Supervising Provider:   Merrilee Mcbride [[id number]]     *If you need refills on other medications prior to your next appointment, please contact your pharmacy*  Follow-Up: Call back or seek an in-person evaluation if the symptoms worsen or if the condition fails to improve as anticipated.  [facility] [phone]  Other Instructions  Spider Bite: What to Know Spider bites are  not common. When spider bites do happen, most don't cause serious health problems. There are only a few types of spider bites that can cause serious health problems. What are the causes? A spider bite is often caused by a person accidentally making contact with a spider in a way that traps the spider against the skin. What increases the risk? You're more likely to be bitten by a spider if: You live in an area where spiders live, and you disturb their habitat. You work outdoors, such as working as a Clinical biochemist. You do certain outdoor activities, like playing in leaves or hiking. What are the  signs or symptoms? Some spider bites may cause symptoms within 1 hour after the bite. For other spider bites, it may take 1-2 days for symptoms to appear. Common symptoms include: A raised red area on your skin. Redness and swelling around the area of the bite. Pain in the area of the bite. A few types of spiders can inject poison, or venom, into a bite wound. These include black widow and brown recluse spiders. Bites from these spiders cause more serious symptoms, such as: Muscle cramps. Feeling like you may throw up. Throwing up. Belly pain. A fever. A skin sore that spreads. This can break into an open wound. Feeling light-headed or dizzy. How is this diagnosed? A spider bite may be diagnosed based on your symptoms and a physical exam. Your health care provider will ask for any details you have about the spider and how the bite happened. This may help to find out what type of spider bit you. How is this treated? Many spider bites don't need treatment. If needed, treatment may include: Icing and keeping the bite area raised. Medicines to help control symptoms like pain and itching. This might be medicines taken by mouth or put on the bite area. A tetanus shot. Antibiotics. Follow these instructions at home: Medicines Take or apply your medicines only as told. If you were given antibiotics, take or use them as told. Do not stop taking or using them even if you start to feel better. Managing pain and swelling  Use ice or an ice pack as told. Place a towel between your skin and the ice. Leave the ice on for 20 minutes, 2-3 times a day. If your skin turns red, take off the ice right away to prevent skin damage. The risk of damage is higher if you can't feel pain, heat, or cold. Raise the bite area above the level of your heart while you're sitting or lying down. Use pillows as needed. General instructions  Do not scratch the bite area. Keep the bite area clean and dry. Wash the area  with soap and water each day as told by your provider. Keep all follow-up visits if your provider needs to check on your bite. Contact a health care provider if: Your bite doesn't get better after 3 days of treatment. Your bite turns black or purple. You have more redness, swelling, or pain at the site of the bite. Get help right away if: You get shortness of breath or chest pain. You have fluid, blood, or pus coming from the bite area. You have painful muscle cramps or sudden muscle tightening (spasms). You have belly pain. You feel like you may throw up or you do throw up. You feel more tired or sleepy than normal. These symptoms may be an emergency. Call 911 right away. Do not wait to see if the symptoms will  go away. Do not drive yourself to the hospital. This information is not intended to replace advice given to you by your health care provider. Make sure you discuss any questions you have with your health care provider. Document Revised: 05/22/2023 Document Reviewed: 12/08/2022 Elsevier Patient Education  2024 [employer].   If you have been instructed to have an in-person evaluation today at a local Urgent Care facility, please use the link below. It will take you to a list of all of our available [facility], including address, phone number and hours of operation. Please do not delay care.  [facility]  If you or a family member do not have a primary care provider, use the link below to schedule a visit and establish care. When you choose a [facility] primary care physician or advanced practice provider, you gain a long-term partner in health. Find a Primary Care Provider  Learn more about [facility]'s in-office and virtual care options: [facility] - Get Care Now

## 2023-10-29 NOTE — Progress Notes (Signed)
 Virtual Visit Consent   Alice Mcbride, you are scheduled for a virtual visit with a [facility] provider today. Just as with appointments in the office, your consent must be obtained to participate. Your consent will be active for this visit and any virtual visit you may have with one of our providers in the next 365 days. If you have a MyChart account, a copy of this consent can be sent to you electronically.  As this is a virtual visit, video technology does not allow for your provider to perform a traditional examination. This may limit your provider's ability to fully assess your condition. If your provider identifies any concerns that need to be evaluated in person or the need to arrange testing (such as labs, EKG, etc.), we will make arrangements to do so. Although advances in technology are sophisticated, we cannot ensure that it will always work on either your end or our end. If the connection with a video visit is poor, the visit may have to be switched to a telephone visit. With either a video or telephone visit, we are not always able to ensure that we have a secure connection.  By engaging in this virtual visit, you consent to the provision of healthcare and authorize for your insurance to be billed (if applicable) for the services provided during this visit. Depending on your insurance coverage, you may receive a charge related to this service.  I need to obtain your verbal consent now. Are you willing to proceed with your visit today? Alice Mcbride has provided verbal consent on 10/29/2023 for a virtual visit (video or telephone). Margaretann Loveless, PA-C  Date: 10/29/2023 10:40 AM   Virtual Visit via Video Note   I, Margaretann Loveless, connected with  Alice Mcbride  ([id number], 1985-10-13) on 10/29/23 at 10:45 AM EDT by a video-enabled telemedicine application and verified that I am speaking with the correct person using two identifiers.  Location: Patient: Virtual Visit  Location Patient: Home Provider: Virtual Visit Location Provider: Home Office   I discussed the limitations of evaluation and management by telemedicine and the availability of in person appointments. The patient expressed understanding and agreed to proceed.    History of Present Illness: Alice Mcbride is a 38 y.o. who identifies as a female who was assigned female at birth, and is being seen today for possible insect bite. 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 She swiped and turned around and saw a spider hanging down from the ceiling. She did not get close enough to see what kind. She reports initially she could see like three tiny areas. She had come home and had try to rest some. When she awoke the swelling had progressed down her forehead with redness and pain.    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   Chronic left-sided low back pain with left-sided sciatica 08/11/2021   Anxiety and depression 08/11/2021   Chronic migraine with aura 08/11/2021    Allergies:  Allergies  Allergen Reactions   Bee Venom    Medications:  Current Outpatient Medications:    doxycycline (VIBRA-TABS) 100 MG tablet, Take  1 tablet (100 mg total) by mouth 2 (two) times daily., Disp: 14 tablet, Rfl: 0   predniSONE (STERAPRED UNI-PAK 21 TAB) 10 MG (21) TBPK tablet, 6 day taper take as directed on package instructions, Disp: 21 tablet, Rfl: 0   cyclobenzaprine (FLEXERIL) 10 MG tablet, Take 1 tablet (10 mg total) by mouth 3 (three) times daily as needed for muscle spasms. (Patient not taking: Reported on 10/05/2023), Disp: 30 tablet, Rfl: 11    DULoxetine (CYMBALTA) 60 MG capsule, Take 1 capsule (60 mg total) by mouth daily., Disp: 90 capsule, Rfl: 3   gabapentin (NEURONTIN) 300 MG capsule, Take 1 capsule (300 mg total) by mouth at bedtime., Disp: 30 capsule, Rfl: 1   IBUPROFEN PO, Take by mouth as needed., Disp: , Rfl:    meloxicam (MOBIC) 15 MG tablet, Take 1 tablet (15 mg total) by mouth daily. (Patient not taking: Reported on 10/05/2023), Disp: 30 tablet, Rfl: 0   montelukast (SINGULAIR) 10 MG tablet, Take 1 tablet (10 mg total) by mouth at bedtime., Disp: 90 tablet, Rfl: 3   ondansetron (ZOFRAN-ODT) 4 MG disintegrating tablet, Take 1 tablet (4 mg total) by mouth every 8 (eight) hours as needed for nausea or vomiting., Disp: 30 tablet, Rfl: 0   topiramate (TOPAMAX) 50 MG tablet, Take 3 tablets (150 mg total) by mouth at bedtime., Disp: 270 tablet, Rfl: 1   valACYclovir (VALTREX) 1000 MG tablet, Take 2 tablets (2,000 mg total) by mouth 2 (two) times daily as needed. Take 2 tablets in the morning and 2 tablets at night for 1 day prn outbreak, Disp: 20 tablet, Rfl: 2   Vitamin D, Ergocalciferol, (DRISDOL) 1.25 MG (50000 UNIT) CAPS capsule, Take 1 capsule (50,000 Units total) by mouth every 7 (seven) days., Disp: 12 capsule, Rfl: 0  Observations/Objective: Patient is well-developed, well-nourished in no acute distress.  Resting comfortably at home.  Head is normocephalic, atraumatic.  No labored breathing.  Speech is clear and coherent with logical content.  Patient is alert and oriented at baseline.    Assessment and Plan: 1. Swelling (Primary) - predniSONE (STERAPRED UNI-PAK 21 TAB) 10 MG (21) TBPK tablet; 6 day taper take as directed on package instructions  Dispense: 21 tablet; Refill: 0 - doxycycline (VIBRA-TABS) 100 MG tablet; Take 1 tablet (100 mg total) by mouth 2 (two) times daily.  Dispense: 14 tablet; Refill: 0  2. Redness - predniSONE (STERAPRED UNI-PAK 21 TAB) 10 MG (21) TBPK tablet; 6 day taper take as directed on  package instructions  Dispense: 21 tablet; Refill: 0 - doxycycline (VIBRA-TABS) 100 MG tablet; Take 1 tablet (100 mg total) by mouth 2 (two) times daily.  Dispense: 14 tablet; Refill: 0  3. Spider bite wound, accidental or unintentional, initial encounter - predniSONE (STERAPRED UNI-PAK 21 TAB) 10 MG (21) TBPK tablet; 6 day taper take as directed on package instructions  Dispense: 21 tablet; Refill: 0 - doxycycline (VIBRA-TABS) 100 MG tablet; Take 1 tablet (100 mg total) by mouth 2 (two) times daily.  Dispense: 14 tablet; Refill: 0  - Unknown type of spider with progressing histamine reaction  - Will provide prednisone for possible allergic reaction - Can also use OTC antihistamines as needed - Doxycycline added for possible secondary infection or if a brown recluse bite - Monitor closely for changes - Seek in person evaluation if worsening or fails to improve  Follow Up Instructions: I discussed the assessment and treatment plan with the patient. The patient was provided an opportunity to ask questions  and all were answered. The patient agreed with the plan and demonstrated an understanding of the instructions.  A copy of instructions were sent to the patient via MyChart unless otherwise noted below.    The patient was advised to call back or seek an in-person evaluation if the symptoms worsen or if the condition fails to improve as anticipated.    Margaretann Loveless, PA-C

## 2023-12-18 ENCOUNTER — Other Ambulatory Visit: Payer: Self-pay | Admitting: Nurse Practitioner

## 2023-12-18 NOTE — Telephone Encounter (Signed)
 Requesting: GABAPENTIN  300 MG CAPSULE  Last Visit: 10/05/2023 Next Visit: 01/07/2024 Last Refill: 10/05/2023  Please Advise   Pharmacy comment: Script Clarification:MUST HAVE 90 DAY SUPPLY PER INSURANCE.
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
## 2024-01-07 ENCOUNTER — Ambulatory Visit (INDEPENDENT_AMBULATORY_CARE_PROVIDER_SITE_OTHER): Admitting: Nurse Practitioner

## 2024-01-07 ENCOUNTER — Encounter: Payer: Self-pay | Admitting: Nurse Practitioner

## 2024-01-07 VITALS — BP 116/72 | HR 89 | Temp 97.9°F | Ht 69.0 in | Wt 198.6 lb

## 2024-01-07 DIAGNOSIS — G2581 Restless legs syndrome: Secondary | ICD-10-CM

## 2024-01-07 DIAGNOSIS — J310 Chronic rhinitis: Secondary | ICD-10-CM | POA: Diagnosis not present

## 2024-01-07 MED ORDER — ROPINIROLE HCL 0.25 MG PO TABS: 0.2500 mg | ORAL_TABLET | Freq: Every day | ORAL | 1 refills | Status: DC

## 2024-01-07 NOTE — Assessment & Plan Note (Addendum)
 Chronic, not controlled. Gabapentin  was ineffective, and iron deficiency has been ruled out. Low vitamin D  levels are being addressed with supplements. Discontinue gabapentin  and initiate Requip (ropinirole) at 0.25 mg, one tablet 1-2 hours before bedtime for 3 days, then increase to two tablets if needed. Monitor for effectiveness and side effects. Follow up in 2-3 months.

## 2024-01-07 NOTE — Patient Instructions (Signed)
 It was great to see you!  Stop the gabapentin   Start requip 1 tablet 1-2 hours before bedtime for 3 days, then increase to 2 tablets if needed   Let's follow-up in 2-3 months, sooner if you have concerns.  If a referral was placed today, you will be contacted for an appointment. Please note that routine referrals can sometimes take up to 3-4 weeks to process. Please call our office if you haven't heard anything after this time frame.  Take care,  Rheba Cedar, NP

## 2024-01-07 NOTE — Progress Notes (Signed)
 Established Patient Office Visit  Subjective   Patient ID: Alice Mcbride, female    DOB: [birth year]  Age: 38 y.o. MRN: [id number]  Chief Complaint  Patient presents with   Restless Legs    Follow up, concerns with medicine not helping    HPI  Discussed the use of AI scribe software for clinical note transcription with the patient, who gave verbal consent to proceed.  History of Present Illness   Alice Mcbride is a 38 year old female with restless leg syndrome who presents for follow-up due to ineffective treatment with gabapentin .  Gabapentin  has not effectively managed her restless leg syndrome. She experiences significant symptoms, including insomnia until 5 AM on nights she takes the medication. She cannot take gabapentin  while at work.  Recent lab work shows normal ferritin, iron levels, and vitamin B12, but low vitamin D  levels. She has started a vitamin D  supplement and increased outdoor activity.  She takes Singulair  daily for allergies, with Allegra and Flonase nasal spray as needed. Allergy symptoms occur daily, especially at the beginning and end of the year. Her mother has similar symptoms. She uses saline nasal rinses occasionally when symptoms are severe.        ROS See pertinent positives and negatives per HPI.    Objective:     BP 116/72 (BP Location: Left Arm, Patient Position: Sitting, Cuff Size: Normal)   Pulse 89   Temp 97.9 F (36.6 C)   Ht 5' 9 (1.753 m)   Wt 198 lb 9.6 oz (90.1 kg)   LMP 12/19/2023 (Approximate)   SpO2 98%   BMI 29.33 kg/m    Physical Exam Vitals and nursing note reviewed.  Constitutional:      General: She is not in acute distress.    Appearance: Normal appearance.  HENT:     Head: Normocephalic.     Nose: Rhinorrhea present.   Eyes:     Conjunctiva/sclera: Conjunctivae normal.    Cardiovascular:     Rate and Rhythm: Normal rate and regular rhythm.     Pulses: Normal pulses.     Heart sounds: Normal  heart sounds.  Pulmonary:     Effort: Pulmonary effort is normal.     Breath sounds: Normal breath sounds.   Musculoskeletal:     Cervical back: Normal range of motion.   Skin:    General: Skin is warm.   Neurological:     General: No focal deficit present.     Mental Status: She is alert and oriented to person, place, and time.   Psychiatric:        Mood and Affect: Mood normal.        Behavior: Behavior normal.        Thought Content: Thought content normal.        Judgment: Judgment normal.      Assessment & Plan:   Problem List Items Addressed This Visit       Respiratory   Chronic rhinitis   Persistent symptoms are managed with Singulair , Allegra, and Flonase. She is open to using neti pots and nasal rinses. Continue Singulair , Allegra, and Flonase as prescribed. Use neti pots and nasal rinses as needed. Consider an allergist referral if symptoms persist or worsen if she is interested.         Other   Restless leg syndrome - Primary   Chronic, not controlled. Gabapentin  was ineffective, and iron deficiency has been ruled out. Low vitamin D  levels are being  addressed with supplements. Discontinue gabapentin  and initiate Requip (ropinirole) at 0.25 mg, one tablet 1-2 hours before bedtime for 3 days, then increase to two tablets if needed. Monitor for effectiveness and side effects. Follow up in 2-3 months.        Return in about 3 months (around 04/08/2024) for 2-3 months RLS, can be virtual .    Odette Benjamin, NP

## 2024-01-07 NOTE — Assessment & Plan Note (Signed)
 Persistent symptoms are managed with Singulair , Allegra, and Flonase. She is open to using neti pots and nasal rinses. Continue Singulair , Allegra, and Flonase as prescribed. Use neti pots and nasal rinses as needed. Consider an allergist referral if symptoms persist or worsen if she is interested.

## 2024-01-29 ENCOUNTER — Other Ambulatory Visit: Payer: Self-pay | Admitting: Nurse Practitioner

## 2024-01-30 NOTE — Telephone Encounter (Signed)
 Requesting: ROPINIROLE  HCL 0.25 MG TABLET  Last Visit: 01/07/2024 Next Visit: 04/14/2024 Last Refill: 01/07/2024  Please Advise     Pharmacy comment: REQUEST FOR 90 DAYS PRESCRIPTION.

## 2024-03-04 ENCOUNTER — Ambulatory Visit (LOCAL_COMMUNITY_HEALTH_CENTER)

## 2024-03-04 DIAGNOSIS — Z111 Encounter for screening for respiratory tuberculosis: Secondary | ICD-10-CM

## 2024-03-04 NOTE — Progress Notes (Signed)
 Tuberculin skin test applied to left ventral forearm.  Explained not to use lotions or creams, place bandages or wraps on testing area.  Advised, if bleeding occurs, to lightly dab testing area and given 2x2 gauze in case of leakage or bleeding.  Kandi KATHEE Glatter, RN

## 2024-03-06 ENCOUNTER — Ambulatory Visit (LOCAL_COMMUNITY_HEALTH_CENTER)

## 2024-03-06 DIAGNOSIS — Z111 Encounter for screening for respiratory tuberculosis: Secondary | ICD-10-CM

## 2024-03-06 LAB — TB SKIN TEST
Induration: 0 mm
TB Skin Test: NEGATIVE

## 2024-03-31 ENCOUNTER — Telehealth: Admitting: Nurse Practitioner

## 2024-03-31 ENCOUNTER — Encounter: Payer: Self-pay | Admitting: Nurse Practitioner

## 2024-03-31 VITALS — Ht 69.0 in | Wt 180.0 lb

## 2024-03-31 DIAGNOSIS — R4189 Other symptoms and signs involving cognitive functions and awareness: Secondary | ICD-10-CM | POA: Diagnosis not present

## 2024-03-31 DIAGNOSIS — G43E09 Chronic migraine with aura, not intractable, without status migrainosus: Secondary | ICD-10-CM

## 2024-03-31 DIAGNOSIS — G2581 Restless legs syndrome: Secondary | ICD-10-CM

## 2024-03-31 MED ORDER — QULIPTA 60 MG PO TABS: 60.0000 mg | ORAL_TABLET | Freq: Every day | ORAL | 1 refills | Status: DC

## 2024-03-31 NOTE — Progress Notes (Signed)
 [facility] [address] [place] [address] Dept: [phone] Dept Fax: [phone]  Virtual Video Visit  I connected with Alice Mcbride on 03/31/24 at  2:00 PM EDT by a video enabled telemedicine application and verified that I am speaking with the correct person using two identifiers.  Location patient: Home Location provider: Clinic Persons participating in the virtual visit: Patient; Tinnie Harada, NP; Laymon Gladis Sharps, CMA  I discussed the limitations of evaluation and management by telemedicine and the availability of in person appointments. The patient expressed understanding and agreed to proceed.  Chief Complaint  Patient presents with   Medication Management    Concerns with brain fog and memory loss for a few months and questions if from Topamax     SUBJECTIVE:  HPI:   Discussed the use of AI scribe software for clinical note transcription with the patient, who gave verbal consent to proceed.  History of Present Illness   Alice Mcbride is a 38 year old female with a history of migraines and restless leg syndrome who presents with brain fog and forgetfulness.  She experiences episodes of brain fog and forgetfulness, which have been increasing in frequency and severity over the past few months. These episodes include forgetting tasks, such as not recalling receiving or completing a paper at school.  She has been on Topamax  since 2018 for migraine prevention, which has been somewhat effective, though she still experiences cluster migraines during her menstrual cycle. She manages occasional headaches with ibuprofen. Previous treatments included triptans, which caused adverse side effects, and propranolol , which she used briefly before switching to Topamax .  For restless leg syndrome, she started Requip  in June, which has been effective. She takes it at night when not working. She is concerned about  potential cognitive side effects from her medication regimen.     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   Chronic left-sided low back pain with left-sided sciatica 08/11/2021   Anxiety and depression 08/11/2021   Chronic migraine with aura 08/11/2021    Past Surgical History:  Procedure Laterality Date   WISDOM TOOTH EXTRACTION      Family History  Problem Relation Age of Onset   Hypertension Mother    Migraines Neg Hx     Social History   Tobacco Use   Smoking status: Former    Current packs/day: 0.00    Types: Cigarettes    Quit date: 2011    Years since quitting: 14.6    Passive exposure: Current   Smokeless tobacco: Current   Tobacco comments:    Vapes daily  Vaping Use   Vaping status: Every Day   Substances: Nicotine  Substance Use Topics   Alcohol use: Yes    Alcohol/week: 2.0 standard drinks of alcohol    Types: 2 Cans of beer per week    Comment: occasionally   Drug use: No     Current Outpatient Medications:    Atogepant  (QULIPTA ) 60 MG TABS, Take 1 tablet (60 mg total) by mouth daily at 6 (six) AM., Disp: 30 tablet, Rfl: 1   cyclobenzaprine  (FLEXERIL ) 10 MG tablet, Take 1 tablet (10 mg  total) by mouth 3 (three) times daily as needed for muscle spasms., Disp: 30 tablet, Rfl: 11   DULoxetine  (CYMBALTA ) 60 MG capsule, Take 1 capsule (60 mg total) by mouth daily., Disp: 90 capsule, Rfl: 3   IBUPROFEN PO, Take by mouth as needed., Disp: , Rfl:    montelukast  (SINGULAIR ) 10 MG tablet, Take 1 tablet (10 mg total) by mouth at bedtime., Disp: 90 tablet, Rfl: 3   ondansetron  (ZOFRAN -ODT) 4 MG disintegrating tablet, Take 1 tablet  (4 mg total) by mouth every 8 (eight) hours as needed for nausea or vomiting., Disp: 30 tablet, Rfl: 0   rOPINIRole  (REQUIP ) 0.25 MG tablet, START 1 TABLET AT BEDTIME FOR 3 DAYS, THEN CAN INCREASE TO 2 TABLETS AT BEDTIME, Disp: 180 tablet, Rfl: 1   topiramate  (TOPAMAX ) 50 MG tablet, Take 3 tablets (150 mg total) by mouth at bedtime., Disp: 270 tablet, Rfl: 1   valACYclovir  (VALTREX ) 1000 MG tablet, Take 2 tablets (2,000 mg total) by mouth 2 (two) times daily as needed. Take 2 tablets in the morning and 2 tablets at night for 1 day prn outbreak, Disp: 20 tablet, Rfl: 2   meloxicam  (MOBIC ) 15 MG tablet, Take 1 tablet (15 mg total) by mouth daily. (Patient not taking: Reported on 03/31/2024), Disp: 30 tablet, Rfl: 0  Allergies  Allergen Reactions   Bee Venom     ROS: See pertinent positives and negatives per HPI.  OBSERVATIONS/OBJECTIVE:  VITALS per patient if applicable: Today's Vitals   03/31/24 1349  Weight: 180 lb (81.6 kg)  Height: 5' 9 (1.753 m)   Body mass index is 26.58 kg/m.    GENERAL: Alert and oriented. Appears well and in no acute distress.  HEENT: Atraumatic. Conjunctiva clear. No obvious abnormalities on inspection of external nose and ears.  NECK: Normal movements of the head and neck.  LUNGS: On inspection, no signs of respiratory distress. Breathing rate appears normal. No obvious gross SOB, gasping or wheezing, and no conversational dyspnea.  CV: No obvious cyanosis.  MS: Moves all visible extremities without noticeable abnormality.  PSYCH/NEURO: Pleasant and cooperative. No obvious depression or anxiety. Speech and thought processing grossly intact.  ASSESSMENT AND PLAN:  Problem List Items Addressed This Visit       Cardiovascular and Mediastinum   Chronic migraine with aura - Primary   Chronic migraines, particularly around menstruation, are not well-managed with Topamax , and previous treatments were not tolerated (propranolol , amitriptyline, 2  triptans). Qulipta  is a potential alternative. Initiate Qulipta  60mg  once daily for migraine prevention. Taper Topamax  by reducing to 100 mg daily for 4 weeks, then 50 mg daily for 4 weeks, and then discontinue. Continue ibuprofen for acute headache relief as needed.       Relevant Medications   Atogepant  (QULIPTA ) 60 MG TABS     Other   Restless leg syndrome   Restless legs syndrome is well-controlled with Requip , which she prefers to continue. Continue Requip  for restless legs syndrome.       Brain fog   Cognitive impairment is likely due to Topamax , with Requip  as a possible contributor. Taper Topamax  as outlined in the migraine management plan. Monitor cognitive symptoms during and after the Topamax  taper.      I discussed the assessment and treatment plan with the patient. The patient was provided an opportunity to ask questions and all were answered. The patient agreed with the plan and demonstrated an understanding of the instructions.   The patient was advised to call back  or seek an in-person evaluation if the symptoms worsen or if the condition fails to improve as anticipated.   [name], NP

## 2024-03-31 NOTE — Assessment & Plan Note (Signed)
 Chronic migraines, particularly around menstruation, are not well-managed with Topamax , and previous treatments were not tolerated (propranolol , amitriptyline, 2 triptans). Qulipta  is a potential alternative. Initiate Qulipta  60mg  once daily for migraine prevention. Taper Topamax  by reducing to 100 mg daily for 4 weeks, then 50 mg daily for 4 weeks, and then discontinue. Continue ibuprofen for acute headache relief as needed.

## 2024-03-31 NOTE — Assessment & Plan Note (Signed)
 Cognitive impairment is likely due to Topamax , with Requip  as a possible contributor. Taper Topamax  as outlined in the migraine management plan. Monitor cognitive symptoms during and after the Topamax  taper.

## 2024-03-31 NOTE — Assessment & Plan Note (Signed)
 Restless legs syndrome is well-controlled with Requip , which she prefers to continue. Continue Requip  for restless legs syndrome.

## 2024-03-31 NOTE — Patient Instructions (Signed)
 VISIT SUMMARY:  Today, we discussed your ongoing issues with migraines, brain fog, and restless leg syndrome. We reviewed your current medications and their potential side effects, and we have made some adjustments to better manage your symptoms.  YOUR PLAN:  -MIGRAINE PROPHYLAXIS AND MANAGEMENT: Chronic migraines, especially around your menstrual cycle, are not well-managed with your current medication, Topamax . We will start you on Qulipta  once daily for migraine prevention. You will taper off Topamax  by reducing to 100 mg daily for 4 weeks, then 50 mg daily for 4 weeks, and then discontinue it. Continue using ibuprofen as needed for acute headache relief. Your prescription will be sent to [facility] on [address].  -COGNITIVE IMPAIRMENT (FORGETFULNESS AND BRAIN FOG) SECONDARY TO MEDICATION: Your forgetfulness and brain fog are likely due to Topamax , with Requip  possibly contributing. We will monitor your cognitive symptoms as you taper off Topamax  according to the migraine management plan.  -RESTLESS LEGS SYNDROME: Your restless legs syndrome is well-controlled with Requip , which you will continue to take as needed at night.  INSTRUCTIONS:  Please follow the tapering schedule for Topamax  as outlined. Start taking Qulipta  once daily for migraine prevention. Continue using ibuprofen for acute headaches. Monitor your cognitive symptoms and report any changes. Your prescription for Qulipta  has been sent to [facility] on [address].  Let's follow-up in 4-6 weeks.

## 2024-04-03 ENCOUNTER — Ambulatory Visit: Admitting: Nurse Practitioner

## 2024-04-04 ENCOUNTER — Telehealth: Payer: Self-pay

## 2024-04-04 ENCOUNTER — Other Ambulatory Visit (HOSPITAL_COMMUNITY): Payer: Self-pay

## 2024-04-04 NOTE — Telephone Encounter (Signed)
 Pharmacy Patient Advocate Encounter  Received notification from [employer] that Prior Authorization for Qulipta  60MG  tablets  has been APPROVED from 9.12.25 to 9.12.26. Ran test claim, Copay is $0.00. This test claim was processed through [facility]- copay amounts may vary at other pharmacies due to pharmacy/plan contracts, or as the patient moves through the different stages of their insurance plan.   PA #/Case ID/Reference #: [id number]

## 2024-04-04 NOTE — Telephone Encounter (Signed)
 Can we get a prior auth of Quilipta 60mg  ?

## 2024-04-14 ENCOUNTER — Encounter: Payer: Self-pay | Admitting: Nurse Practitioner

## 2024-04-14 ENCOUNTER — Telehealth: Admitting: Nurse Practitioner

## 2024-04-14 VITALS — Ht 69.0 in | Wt 185.0 lb

## 2024-04-14 DIAGNOSIS — R4189 Other symptoms and signs involving cognitive functions and awareness: Secondary | ICD-10-CM | POA: Diagnosis not present

## 2024-04-14 DIAGNOSIS — G2581 Restless legs syndrome: Secondary | ICD-10-CM | POA: Diagnosis not present

## 2024-04-14 DIAGNOSIS — G43E09 Chronic migraine with aura, not intractable, without status migrainosus: Secondary | ICD-10-CM

## 2024-04-14 NOTE — Assessment & Plan Note (Signed)
 Chronic, stable. Continue requip  0.5mg  at bedtime.

## 2024-04-14 NOTE — Patient Instructions (Signed)
 It was great to see you!  Keep tapering off the topamax    Start qulipta  1 tablet daily  Let's follow-up in 3 months, sooner if you have concerns.  If a referral was placed today, you will be contacted for an appointment. Please note that routine referrals can sometimes take up to 3-4 weeks to process. Please call our office if you haven't heard anything after this time frame.  Take care,  Tinnie Harada, NP

## 2024-04-14 NOTE — Assessment & Plan Note (Signed)
 Persistent brain fog may be a side effect of Requip , and Topamax  clearance may take time. Consider Requip  as a cause if symptoms persist after stopping Topamax .

## 2024-04-14 NOTE — Progress Notes (Signed)
 [facility] [address] [place] [address] Dept: [phone] Dept Fax: [phone]  Virtual Video Visit  I connected with Alice Mcbride on 04/14/24 at  1:00 PM EDT by a video enabled telemedicine application and verified that I am speaking with the correct person using two identifiers.  Location patient: Home Location provider: Clinic Persons participating in the virtual visit: Patient; Tinnie Harada, NP; Laymon Gladis Sharps, CMA  I discussed the limitations of evaluation and management by telemedicine and the availability of in person appointments. The patient expressed understanding and agreed to proceed.  Chief Complaint  Patient presents with   Restless leg syndrome    Follow up, no concerns    SUBJECTIVE:  HPI:  Discussed the use of AI scribe software for clinical note transcription with the patient, who gave verbal consent to proceed.  History of Present Illness   Alice Mcbride is a 38 year old female who presents for follow-up of headaches, restless leg syndrome, and brain fog.  Headaches have been improving, even with tapering off topamax . She has not started Qulipta  due to her busy schedule with school and work.  Restless leg syndrome is manageable and not significantly bothersome. She feels very tired, which may affect her perception of symptoms.  Brain fog persists with no improvement since reducing her medication. She is currently tapering off Topamax , which she has been on for a long time.     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   Anxiety and depression 08/11/2021   Chronic migraine with aura 08/11/2021    Past Surgical History:  Procedure Laterality Date   WISDOM TOOTH EXTRACTION      Family History  Problem Relation Age of Onset   Hypertension Mother    Migraines Neg Hx     Social History   Tobacco Use   Smoking status: Former    Current packs/day: 0.00    Types: Cigarettes    Quit date: 2011    Years since quitting: 14.7    Passive exposure: Current   Smokeless tobacco: Current   Tobacco comments:    Vapes daily  Vaping Use   Vaping status: Every Day   Substances: Nicotine  Substance Use Topics   Alcohol use: Yes    Alcohol/week: 2.0 standard drinks of alcohol    Types: 2 Cans of beer per week    Comment: occasionally   Drug use: No     Current Outpatient Medications:    cyclobenzaprine  (FLEXERIL ) 10 MG tablet, Take 1 tablet (10 mg total) by mouth 3 (three) times daily as needed for muscle spasms., Disp: 30 tablet, Rfl: 11   DULoxetine  (CYMBALTA ) 60 MG capsule, Take 1 capsule (60 mg total) by mouth daily., Disp: 90 capsule, Rfl: 3   IBUPROFEN PO, Take by mouth as needed., Disp: , Rfl:    montelukast  (SINGULAIR ) 10 MG tablet, Take 1 tablet (10 mg total) by mouth at bedtime., Disp: 90 tablet, Rfl: 3   ondansetron  (ZOFRAN -ODT) 4 MG disintegrating tablet, Take 1 tablet (4 mg total) by mouth every 8 (eight) hours  as needed for nausea or vomiting., Disp: 30 tablet, Rfl: 0   rOPINIRole  (REQUIP ) 0.25 MG tablet, START 1 TABLET AT BEDTIME FOR 3 DAYS, THEN CAN INCREASE TO 2 TABLETS AT BEDTIME, Disp: 180 tablet, Rfl: 1   topiramate  (TOPAMAX ) 50 MG tablet, Take 3 tablets (150 mg total) by mouth at bedtime., Disp: 270 tablet, Rfl: 1   valACYclovir  (VALTREX ) 1000 MG tablet, Take 2 tablets (2,000 mg total) by mouth 2 (two) times daily as needed. Take 2 tablets in the morning and 2 tablets at night for 1 day prn outbreak, Disp: 20 tablet, Rfl: 2   Atogepant   (QULIPTA ) 60 MG TABS, Take 1 tablet (60 mg total) by mouth daily at 6 (six) AM. (Patient not taking: Reported on 04/14/2024), Disp: 30 tablet, Rfl: 1   meloxicam  (MOBIC ) 15 MG tablet, Take 1 tablet (15 mg total) by mouth daily. (Patient not taking: Reported on 04/14/2024), Disp: 30 tablet, Rfl: 0  Allergies  Allergen Reactions   Bee Venom     ROS: See pertinent positives and negatives per HPI.  OBSERVATIONS/OBJECTIVE:  VITALS per patient if applicable: Today's Vitals   04/14/24 1300  Weight: 185 lb (83.9 kg)  Height: 5' 9 (1.753 m)   Body mass index is 27.32 kg/m.    GENERAL: Alert and oriented. Appears well and in no acute distress.  HEENT: Atraumatic. Conjunctiva clear. No obvious abnormalities on inspection of external nose and ears.  NECK: Normal movements of the head and neck.  LUNGS: On inspection, no signs of respiratory distress. Breathing rate appears normal. No obvious gross SOB, gasping or wheezing, and no conversational dyspnea.  CV: No obvious cyanosis.  MS: Moves all visible extremities without noticeable abnormality.  PSYCH/NEURO: Pleasant and cooperative. No obvious depression or anxiety. Speech and thought processing grossly intact.  ASSESSMENT AND PLAN:  Problem List Items Addressed This Visit       Cardiovascular and Mediastinum   Chronic migraine with aura - Primary   Chronic, stable. Migraines are decreasing. Qulipta  has not been started yet, and she is tapering off Topamax . Continue tapering off Topamax  with one pill nightly. Start qulipta  60mg  daily when she picks it up from the pharmacy.         Other   Restless leg syndrome   Chronic, stable. Continue requip  0.5mg  at bedtime.       Brain fog   Persistent brain fog may be a side effect of Requip , and Topamax  clearance may take time. Consider Requip  as a cause if symptoms persist after stopping Topamax .        I discussed the assessment and treatment plan with the patient. The patient  was provided an opportunity to ask questions and all were answered. The patient agreed with the plan and demonstrated an understanding of the instructions.   The patient was advised to call back or seek an in-person evaluation if the symptoms worsen or if the condition fails to improve as anticipated.   [name], NP

## 2024-04-14 NOTE — Assessment & Plan Note (Signed)
 Chronic, stable. Migraines are decreasing. Qulipta  has not been started yet, and she is tapering off Topamax . Continue tapering off Topamax  with one pill nightly. Start qulipta  60mg  daily when she picks it up from the pharmacy.

## 2024-05-13 ENCOUNTER — Ambulatory Visit: Admitting: Nurse Practitioner

## 2024-05-13 ENCOUNTER — Telehealth: Payer: Self-pay

## 2024-05-13 NOTE — Telephone Encounter (Unsigned)
 Copied from CRM #[id number]. Topic: Appointments - Appointment Info/Confirmation >> May 13, 2024 11:25 AM Viola FALCON wrote: Patient returned Brittanys phone call - says when she received a text to confirm her appt she did cancel it and received a confirmation that the appointment was cancelled. She did not wish to reschedule at this time since she has an appt scheduled for 07/08/24

## 2024-05-13 NOTE — Telephone Encounter (Signed)
 Noted

## 2024-05-13 NOTE — Telephone Encounter (Signed)
 I called patient at 10:20am and 10:45am to check in for her virtual visit today and left a message to return call. A link was also sent to patient's phone.

## 2024-05-15 NOTE — Telephone Encounter (Signed)
 Confirmed text NO and cancelled the visit 10/21.

## 2024-07-08 ENCOUNTER — Encounter: Payer: Self-pay | Admitting: Nurse Practitioner

## 2024-07-08 ENCOUNTER — Telehealth (INDEPENDENT_AMBULATORY_CARE_PROVIDER_SITE_OTHER): Admitting: Nurse Practitioner

## 2024-07-08 VITALS — Ht 69.0 in | Wt 180.0 lb

## 2024-07-08 DIAGNOSIS — G2581 Restless legs syndrome: Secondary | ICD-10-CM

## 2024-07-08 DIAGNOSIS — R41840 Attention and concentration deficit: Secondary | ICD-10-CM

## 2024-07-08 DIAGNOSIS — F419 Anxiety disorder, unspecified: Secondary | ICD-10-CM | POA: Diagnosis not present

## 2024-07-08 DIAGNOSIS — F32A Depression, unspecified: Secondary | ICD-10-CM | POA: Diagnosis not present

## 2024-07-08 DIAGNOSIS — G43E09 Chronic migraine with aura, not intractable, without status migrainosus: Secondary | ICD-10-CM | POA: Diagnosis not present

## 2024-07-08 MED ORDER — BUPROPION HCL ER (XL) 150 MG PO TB24: 150.0000 mg | ORAL_TABLET | Freq: Every day | ORAL | 1 refills | Status: DC

## 2024-07-08 NOTE — Patient Instructions (Signed)
 It was great to see you!  Start wellbutrin  1 tablet daily in the morning with food  Let's follow-up in 4-6 weeks, sooner if you have concerns.  If a referral was placed today, you will be contacted for an appointment. Please note that routine referrals can sometimes take up to 3-4 weeks to process. Please call our office if you haven't heard anything after this time frame.  Take care,  Tinnie Harada, NP

## 2024-07-08 NOTE — Assessment & Plan Note (Signed)
 Chronic, stable.  She is doing well on Cymbalta 60 mg daily.  We will continue this regimen.  Refill sent to the pharmacy.  Follow-up in 1 year.

## 2024-07-08 NOTE — Assessment & Plan Note (Signed)
 Symptoms have improved, with Requip  needed occasionally. Continue Requip  as needed.

## 2024-07-08 NOTE — Progress Notes (Signed)
 [facility] [address] [place] [address] Dept: [phone] Dept Fax: [phone]  Virtual Video Visit  I connected with Corean HERO Ferrebee on 07/08/2024 at  9:20 AM EST by a video enabled telemedicine application and verified that I am speaking with the correct person using two identifiers.  Location patient: Home Location provider: Clinic Persons participating in the virtual visit: Patient; Alice Harada, NP; Laymon Gladis Sharps, CMA  I discussed the limitations of evaluation and management by telemedicine and the availability of in person appointments. The patient expressed understanding and agreed to proceed.  Chief Complaint  Patient presents with   Migraine    Follow up and discuss medication    SUBJECTIVE:  HPI:  Discussed the use of AI scribe software for clinical note transcription with the patient, who gave verbal consent to proceed.  History of Present Illness   Alice Mcbride is a 38 year old female with migraines who presents for follow-up on her migraine management and focus issues.  She has intermittent migraines, including one yesterday. Since switching from Topamax  to Qulipta  her brain fog is less severe but still present, with persistent memory struggles such as forgetting tasks after reminders.  She has increasing difficulty focusing and remembering things at school compared with when she started in 2008, despite using two calendars to manage her work and school responsibilities.  She uses Requip  at night as needed for restless legs, about twice in the past couple of months, which relieves nighttime symptoms.  She is taking vitamin D  and vitamin C due to the season.        07/08/2024    9:13 AM 04/14/2024    1:02 PM 01/07/2024   10:38 AM 10/05/2023   11:31 AM 09/08/2021   11:08 AM  Depression screen PHQ 2/9  Decreased Interest 0 0 0 0 1  Down, Depressed, Hopeless 0 0 1 1 1   PHQ - 2  Score 0 0 1 1 2   Altered sleeping  1 2 2 2   Tired, decreased energy  1 2 2 2   Change in appetite  0 2 0 0  Feeling bad or failure about yourself   0 0 0 1  Trouble concentrating  1 0 0 0  Moving slowly or fidgety/restless  0 0 0 0  Suicidal thoughts  0 0 0 0  PHQ-9 Score  3  7  5  7    Difficult doing work/chores  Not difficult at all Somewhat difficult Somewhat difficult Somewhat difficult     Data saved with a previous flowsheet row definition    Patient Active Problem List   Diagnosis Date Noted   Impaired concentration 03/31/2024   Chronic rhinitis 01/07/2024   Routine general medical examination at a health care facility 10/05/2023   Restless leg syndrome 10/05/2023   Pure hypercholesterolemia 10/05/2023   Chronic pain of left knee 12/14/2021   Acute non-recurrent frontal sinusitis 11/10/2021   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  OBSERVATIONS/OBJECTIVE:  VITALS per patient if  applicable: Today's Vitals   07/08/24 0912  Weight: 180 lb (81.6 kg)  Height: 5' 9 (1.753 m)   Body mass index is 26.58 kg/m.    GENERAL: Alert and oriented. Appears well and in no acute distress.  HEENT: Atraumatic. Conjunctiva clear. No obvious abnormalities on inspection of external nose and ears.  NECK: Normal movements of the head and neck.  LUNGS: On inspection, no signs of respiratory distress. Breathing rate appears normal. No obvious gross SOB, gasping or wheezing, and no conversational dyspnea.  CV: No obvious cyanosis.  MS: Moves all visible  extremities without noticeable abnormality.  PSYCH/NEURO: Pleasant and cooperative. No obvious depression or anxiety. Speech and thought processing grossly intact.  ASSESSMENT AND PLAN:  Problem List Items Addressed This Visit       Cardiovascular and Mediastinum   Chronic migraine with aura - Primary   Chronic, stable. Qulipta  is effective, and Topamax  was discontinued due to brain fog. Continue Qulipta  60mg  daily for management.      Relevant Medications   buPROPion  (WELLBUTRIN  XL) 150 MG 24 hr tablet     Other   Anxiety and depression   Chronic, stable.  She is doing well on Cymbalta  60 mg daily.  We will continue this regimen.  Refill sent to the pharmacy.  Follow-up in 1 year.       Relevant Medications   buPROPion  (WELLBUTRIN  XL) 150 MG 24 hr tablet   Restless leg syndrome   Symptoms have improved, with Requip  needed occasionally. Continue Requip  as needed.      Impaired concentration   Focus and memory difficulties affect work and school. Wellbutrin  is chosen for its potential energy boost and weight loss benefits. Side effects were discussed, and she was advised to monitor caffeine  intake. Initiate Wellbutrin  150mg  once daily in the morning. Monitor for side effects and discontinue if adverse effects occur. Follow-up is scheduled in 4-6 weeks to assess response.         I discussed the assessment and treatment plan with the patient. The patient was provided an opportunity to ask questions and all were answered. The patient agreed with the plan and demonstrated an understanding of the instructions.   The patient was advised to call back or seek an in-person evaluation if the symptoms worsen or if the condition fails to improve as anticipated.   Alice DELENA Harada, NP      [1]  Social History Tobacco Use   Smoking status: Former    Current packs/day: 0.00    Types: Cigarettes    Quit date: 2011    Years since quitting: 14.9    Passive exposure: Current    Smokeless tobacco: Current   Tobacco comments:    Vapes daily  Vaping Use   Vaping status: Every Day   Substances: Nicotine  Substance Use Topics   Alcohol use: Yes    Alcohol/week: 2.0 standard drinks of alcohol    Types: 2 Cans of beer per week    Comment: occasionally   Drug use: No  [2]  Current Outpatient Medications:    Atogepant  (QULIPTA ) 60 MG TABS, Take 1 tablet (60 mg total) by mouth daily at 6 (six) AM., Disp: 30 tablet, Rfl: 1   buPROPion  (WELLBUTRIN  XL) 150 MG 24 hr tablet, Take 1 tablet (150 mg total) by mouth daily., Disp: 30 tablet, Rfl: 1   cyclobenzaprine  (FLEXERIL ) 10 MG tablet, Take 1 tablet (10 mg total) by mouth 3 (three) times daily as needed for muscle spasms.,  Disp: 30 tablet, Rfl: 11   DULoxetine  (CYMBALTA ) 60 MG capsule, Take 1 capsule (60 mg total) by mouth daily., Disp: 90 capsule, Rfl: 3   IBUPROFEN PO, Take by mouth as needed., Disp: , Rfl:    montelukast  (SINGULAIR ) 10 MG tablet, Take 1 tablet (10 mg total) by mouth at bedtime., Disp: 90 tablet, Rfl: 3   ondansetron  (ZOFRAN -ODT) 4 MG disintegrating tablet, Take 1 tablet (4 mg total) by mouth every 8 (eight) hours as needed for nausea or vomiting., Disp: 30 tablet, Rfl: 0   valACYclovir  (VALTREX ) 1000 MG tablet, Take 2 tablets (2,000 mg total) by mouth 2 (two) times daily as needed. Take 2 tablets in the morning and 2 tablets at night for 1 day prn outbreak, Disp: 20 tablet, Rfl: 2   meloxicam  (MOBIC ) 15 MG tablet, Take 1 tablet (15 mg total) by mouth daily. (Patient not taking: Reported on 07/08/2024), Disp: 30 tablet, Rfl: 0   rOPINIRole  (REQUIP ) 0.25 MG tablet, START 1 TABLET AT BEDTIME FOR 3 DAYS, THEN CAN INCREASE TO 2 TABLETS AT BEDTIME (Patient not taking: Reported on 07/08/2024), Disp: 180 tablet, Rfl: 1 [3]  Allergies Allergen Reactions   Bee Venom

## 2024-07-08 NOTE — Assessment & Plan Note (Signed)
 Focus and memory difficulties affect work and school. Wellbutrin  is chosen for its potential energy boost and weight loss benefits. Side effects were discussed, and she was advised to monitor caffeine  intake. Initiate Wellbutrin  150mg  once daily in the morning. Monitor for side effects and discontinue if adverse effects occur. Follow-up is scheduled in 4-6 weeks to assess response.

## 2024-07-08 NOTE — Assessment & Plan Note (Signed)
 Chronic, stable. Qulipta  is effective, and Topamax  was discontinued due to brain fog. Continue Qulipta  60mg  daily for management.

## 2024-08-02 ENCOUNTER — Other Ambulatory Visit: Payer: Self-pay | Admitting: Nurse Practitioner

## 2024-08-04 NOTE — Telephone Encounter (Signed)
 Requesting: BUPROPION  HCL XL 150 MG TABLET  Last Visit: 01/07/2024 Next Visit: 08/14/2024 Last Refill: 07/08/2024  Please Advise    Pharmacy comment: REQUEST FOR 90 DAYS PRESCRIPTION.

## 2024-08-14 ENCOUNTER — Ambulatory Visit: Admitting: Nurse Practitioner

## 2024-08-14 ENCOUNTER — Encounter: Payer: Self-pay | Admitting: Nurse Practitioner

## 2024-08-14 NOTE — Progress Notes (Incomplete)
 " [facility] [address] [place] [address] Dept: [phone] Dept Fax: [phone]  Virtual Video Visit  I connected with Corean HERO Tsao on 08/14/2024 at  9:20 AM EST by a video enabled telemedicine application and verified that I am speaking with the correct person using two identifiers.  Location patient: Home Location provider: Clinic Persons participating in the virtual visit: Patient; Tinnie Harada, NP; Laymon Gladis Sharps, CMA  I discussed the limitations of evaluation and management by telemedicine and the availability of in person appointments. The patient expressed understanding and agreed to proceed.  No chief complaint on file.   SUBJECTIVE:  HPI: Alice Mcbride is a 39 y.o. female who presents for 49-month follow up.   She has a history of restless leg syndrome. She did not tolerate Gabapentin  300 mg at night, so I started her on Requip  0.25 mg at bedtime.  She takes Qulipta  60 mg daily for migraines, and takes Bupropion  150 mg daily and Duloexetine 60 mg daily for anxiety/depression.  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  OBSERVATIONS/OBJECTIVE:  VITALS per patient if applicable: There were no vitals filed for this visit. There is no height or weight on file to calculate BMI.    GENERAL: Alert and oriented. Appears well and in no acute distress.  HEENT: Atraumatic. Conjunctiva clear. No obvious abnormalities on inspection of external nose and ears.  NECK: Normal movements of the head and neck.  LUNGS: On inspection, no signs of respiratory distress. Breathing rate appears normal. No obvious gross SOB, gasping or wheezing, and no conversational dyspnea.  CV: No obvious cyanosis.  MS: Moves all visible extremities without noticeable abnormality.  PSYCH/NEURO: Pleasant and cooperative. No obvious depression or anxiety. Speech and thought processing grossly intact.  ASSESSMENT AND PLAN:  Problem List Items Addressed This Visit   None    I discussed the assessment and treatment plan with the patient. The patient was provided an opportunity to ask questions and all were answered. The patient agreed with the plan and demonstrated an understanding of the instructions.   The patient was advised to call back or seek an in-person evaluation if the symptoms worsen or if the condition fails to improve as anticipated.   [name], NP  I,Emily Lagle,acting as a scribe for [employer], NP.,have documented all relevant documentation on the behalf of Lauren DELENA Harada, NP.  I, [name], NP, have reviewed all documentation for this visit. The documentation on 08/14/2024 for the exam, diagnosis, procedures, and orders are all accurate and complete.    [1]  Social History Tobacco Use   Smoking status: Former  Current packs/day: 0.00    Types: Cigarettes    Quit date: 2011    Years since quitting: 15.0    Passive exposure: Current   Smokeless tobacco: Current   Tobacco comments:    Vapes daily  Vaping Use   Vaping status: Every Day   Substances: Nicotine  Substance Use  Topics   Alcohol use: Yes    Alcohol/week: 2.0 standard drinks of alcohol    Types: 2 Cans of beer per week    Comment: occasionally   Drug use: No  [2]  Current Outpatient Medications:    Atogepant  (QULIPTA ) 60 MG TABS, Take 1 tablet (60 mg total) by mouth daily at 6 (six) AM., Disp: 30 tablet, Rfl: 1   buPROPion  (WELLBUTRIN  XL) 150 MG 24 hr tablet, TAKE 1 TABLET BY MOUTH EVERY DAY, Disp: 90 tablet, Rfl: 0   cyclobenzaprine  (FLEXERIL ) 10 MG tablet, Take 1 tablet (10 mg total) by mouth 3 (three) times daily as needed for muscle spasms., Disp: 30 tablet, Rfl: 11   DULoxetine  (CYMBALTA ) 60 MG capsule, Take 1 capsule (60 mg total) by mouth daily., Disp: 90 capsule, Rfl: 3   IBUPROFEN PO, Take by mouth as needed., Disp: , Rfl:    meloxicam  (MOBIC ) 15 MG tablet, Take 1 tablet (15 mg total) by mouth daily. (Patient not taking: Reported on 07/08/2024), Disp: 30 tablet, Rfl: 0   montelukast  (SINGULAIR ) 10 MG tablet, Take 1 tablet (10 mg total) by mouth at bedtime., Disp: 90 tablet, Rfl: 3   ondansetron  (ZOFRAN -ODT) 4 MG disintegrating tablet, Take 1 tablet (4 mg total) by mouth every 8 (eight) hours as needed for nausea or vomiting., Disp: 30 tablet, Rfl: 0   rOPINIRole  (REQUIP ) 0.25 MG tablet, START 1 TABLET AT BEDTIME FOR 3 DAYS, THEN CAN INCREASE TO 2 TABLETS AT BEDTIME (Patient not taking: Reported on 07/08/2024), Disp: 180 tablet, Rfl: 1   valACYclovir  (VALTREX ) 1000 MG tablet, Take 2 tablets (2,000 mg total) by mouth 2 (two) times daily as needed. Take 2 tablets in the morning and 2 tablets at night for 1 day prn outbreak, Disp: 20 tablet, Rfl: 2 [3]  Allergies Allergen Reactions   Bee Venom    "

## 2024-08-16 ENCOUNTER — Other Ambulatory Visit: Payer: Self-pay | Admitting: Nurse Practitioner
